# Patient Record
Sex: Male | Born: 1985 | Race: White | Hispanic: No | Marital: Married | Smoking: Never smoker
Health system: Southern US, Community
[De-identification: ages and names within clinical notes are randomized; demographics above are authoritative.]

## PROBLEM LIST (undated history)

## (undated) DIAGNOSIS — Z8659 Personal history of other mental and behavioral disorders: Secondary | ICD-10-CM

## (undated) DIAGNOSIS — R002 Palpitations: Secondary | ICD-10-CM

## (undated) DIAGNOSIS — B019 Varicella without complication: Secondary | ICD-10-CM

## (undated) DIAGNOSIS — R55 Syncope and collapse: Secondary | ICD-10-CM

## (undated) DIAGNOSIS — G937 Reye's syndrome: Secondary | ICD-10-CM

## (undated) HISTORY — DX: Syncope and collapse: R55

## (undated) HISTORY — DX: Personal history of other mental and behavioral disorders: Z86.59

## (undated) HISTORY — DX: Palpitations: R00.2

## (undated) HISTORY — DX: Reye's syndrome: G93.7

## (undated) HISTORY — DX: Varicella without complication: B01.9

## (undated) HISTORY — PX: WISDOM TOOTH EXTRACTION: SHX21

---
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
## 2005-06-12 ENCOUNTER — Ambulatory Visit: Payer: Self-pay | Admitting: Internal Medicine

## 2005-07-14 ENCOUNTER — Ambulatory Visit: Payer: Self-pay | Admitting: Internal Medicine

## 2005-07-21 ENCOUNTER — Ambulatory Visit: Payer: Self-pay | Admitting: Internal Medicine

## 2005-08-23 ENCOUNTER — Ambulatory Visit: Payer: Self-pay | Admitting: Internal Medicine

## 2006-03-06 ENCOUNTER — Ambulatory Visit: Payer: Self-pay | Admitting: Internal Medicine

## 2007-04-12 ENCOUNTER — Ambulatory Visit: Payer: Self-pay | Admitting: Internal Medicine

## 2007-04-12 DIAGNOSIS — R002 Palpitations: Secondary | ICD-10-CM

## 2008-03-19 ENCOUNTER — Ambulatory Visit: Payer: Self-pay | Admitting: Internal Medicine

## 2008-03-19 DIAGNOSIS — D179 Benign lipomatous neoplasm, unspecified: Secondary | ICD-10-CM | POA: Insufficient documentation

## 2008-03-19 DIAGNOSIS — M546 Pain in thoracic spine: Secondary | ICD-10-CM

## 2008-03-19 DIAGNOSIS — G937 Reye's syndrome: Secondary | ICD-10-CM | POA: Insufficient documentation

## 2008-03-19 LAB — CONVERTED CEMR LAB
Glucose, Urine, Semiquant: NEGATIVE
Nitrite: NEGATIVE
Protein, U semiquant: NEGATIVE
WBC Urine, dipstick: NEGATIVE
pH: 6.5

## 2008-03-20 ENCOUNTER — Ambulatory Visit: Payer: Self-pay | Admitting: Internal Medicine

## 2008-03-24 ENCOUNTER — Encounter (INDEPENDENT_AMBULATORY_CARE_PROVIDER_SITE_OTHER): Payer: Self-pay | Admitting: *Deleted

## 2008-07-18 ENCOUNTER — Ambulatory Visit: Payer: Self-pay | Admitting: Family Medicine

## 2008-10-20 ENCOUNTER — Ambulatory Visit: Payer: Self-pay | Admitting: Internal Medicine

## 2008-11-27 ENCOUNTER — Ambulatory Visit: Payer: Self-pay | Admitting: Internal Medicine

## 2008-11-27 DIAGNOSIS — L255 Unspecified contact dermatitis due to plants, except food: Secondary | ICD-10-CM

## 2009-03-16 ENCOUNTER — Ambulatory Visit: Payer: Self-pay | Admitting: Internal Medicine

## 2009-03-16 DIAGNOSIS — K645 Perianal venous thrombosis: Secondary | ICD-10-CM | POA: Insufficient documentation

## 2010-01-24 ENCOUNTER — Ambulatory Visit: Payer: Self-pay | Admitting: Internal Medicine

## 2010-01-24 DIAGNOSIS — M26629 Arthralgia of temporomandibular joint, unspecified side: Secondary | ICD-10-CM

## 2010-04-14 IMAGING — CR DG THORACIC SPINE 2V
3 series · 3 of 3 positions shown · non-contrast
Comparison: None available.

CLINICAL DATA: Back pain.

THORACIC SPINE - 2 VIEW

[view not recorded (1 of 3)]
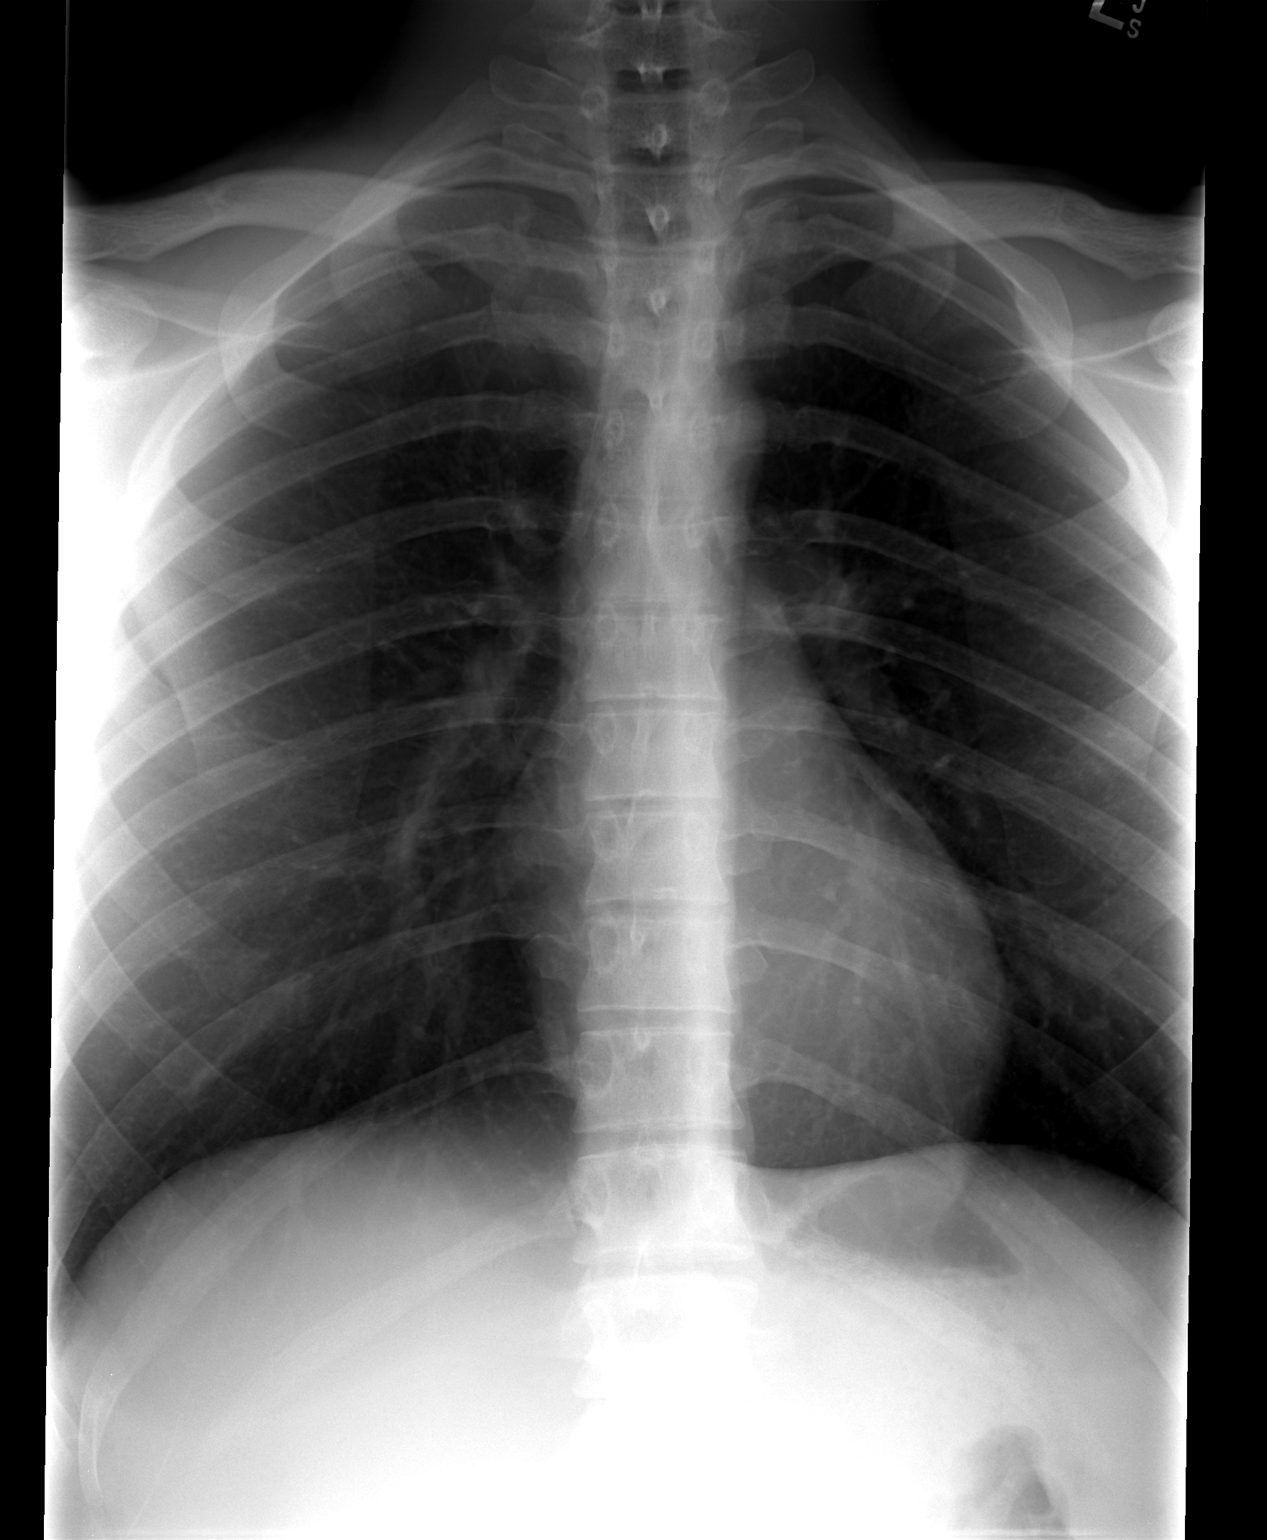

[view not recorded (2 of 3)]
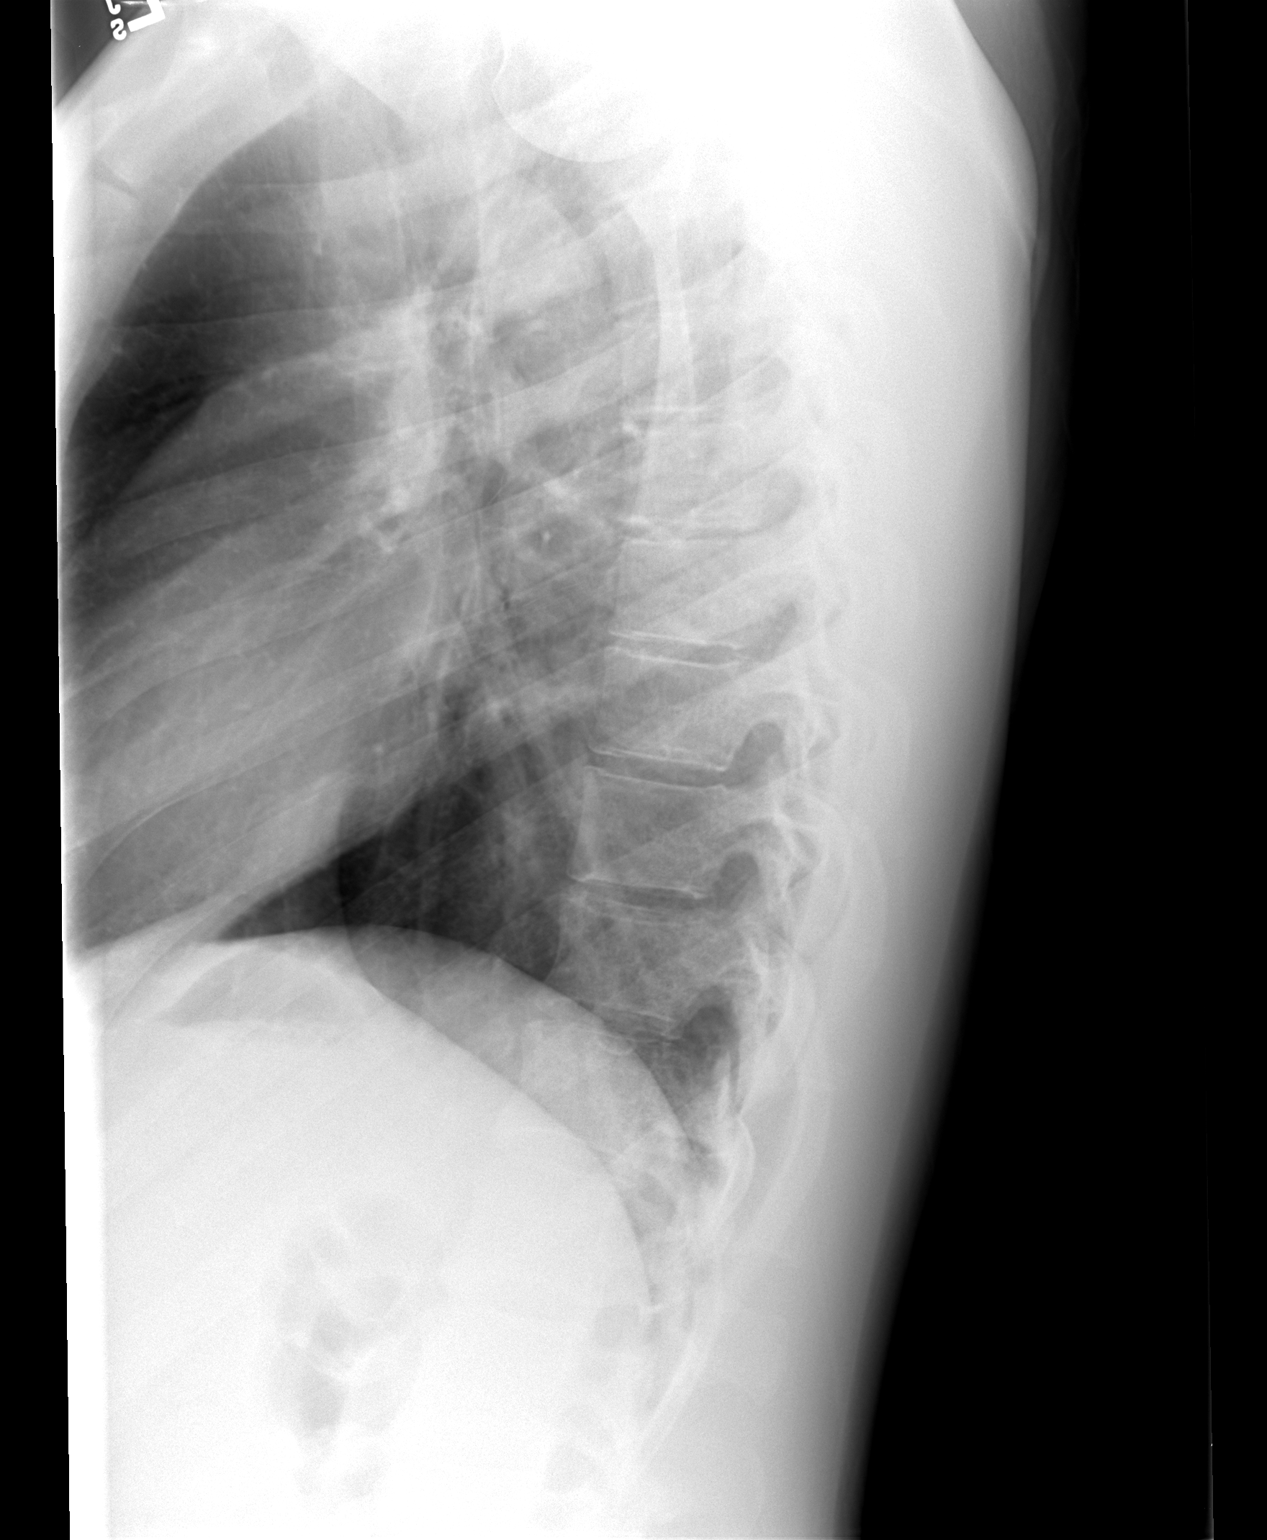

[view not recorded (3 of 3)]
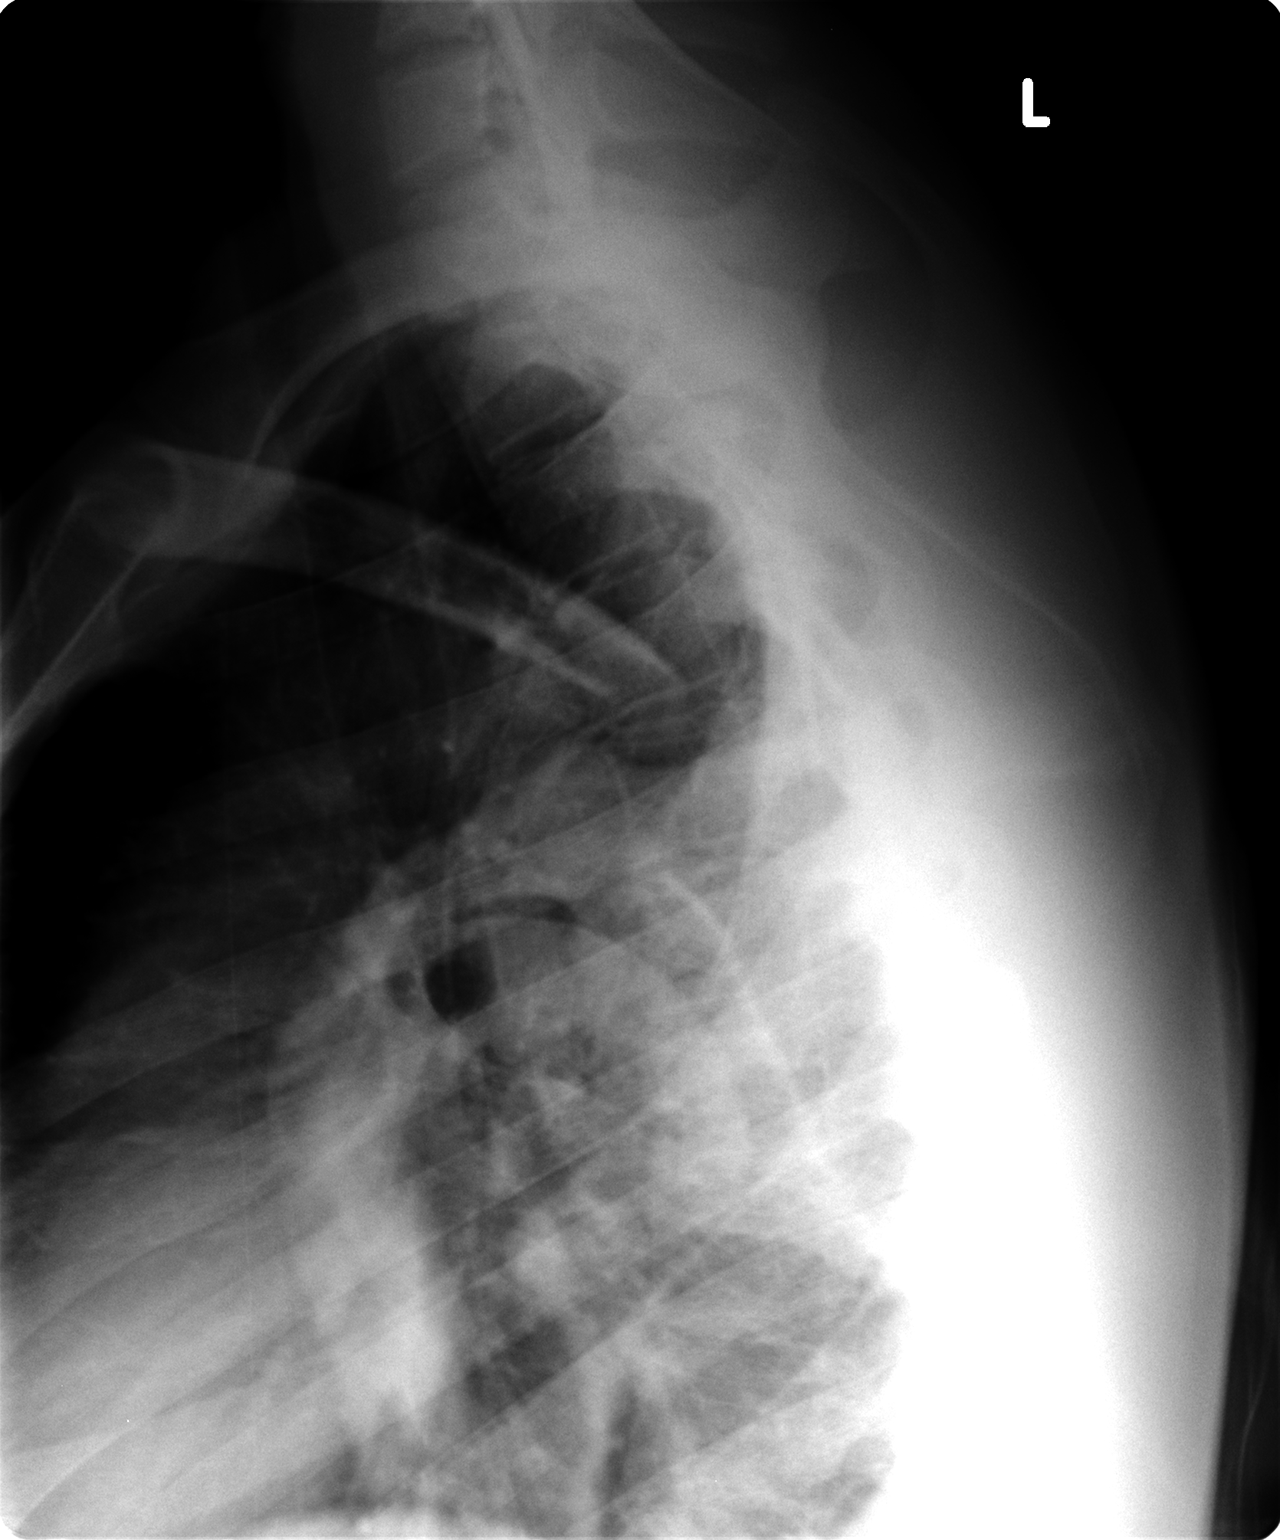

[3 of 3 positions shown; findings below may reference images not displayed]

FINDINGS: Vertebral body height and alignment appear normal.  No
evidence of degenerative disease.
IMPRESSION: Negative exam.

## 2010-07-20 ENCOUNTER — Ambulatory Visit
Admission: RE | Admit: 2010-07-20 | Discharge: 2010-07-20 | Payer: Self-pay | Source: Home / Self Care | Attending: Internal Medicine | Admitting: Internal Medicine

## 2010-07-20 DIAGNOSIS — M545 Low back pain: Secondary | ICD-10-CM | POA: Insufficient documentation

## 2010-07-26 NOTE — Assessment & Plan Note (Signed)
Summary: JAW PAIN//KN   Vital Signs:  Patient profile:   25 year old male Weight:      164.8 pounds Temp:     98.3 degrees F oral Pulse rate:   56 / minute Resp:     12 per minute BP sitting:   100 / 64  (left arm) Cuff size:   large  Vitals Entered By: Shonna Chock CMA (January 24, 2010 12:14 PM) CC: Right side Jaw pain x 1.5 week(s), Lower Extremity Joint pain   CC:  Right side Jaw pain x 1.5 week(s) and Lower Extremity Joint pain.  History of Present Illness:       This is a 25 year old man who presents with R jaw pain upon awakening 01/14/2010.  The patient denies swelling, redness, locking, popping, and decreased ROM.  The pain began suddenly and with no injury.  The pain is described as aching and intermittent.  The patient denies the following symptoms: fever, rash, URI  or eye symptoms.    Current Medications (verified): 1)  None  Allergies: 1)  ! Pcn  Review of Systems General:  Denies chills and sweats. ENT:  Denies ear discharge, earache, nasal congestion, postnasal drainage, and sore throat. Derm:  Denies lesion(s) and rash.  Physical Exam  General:  well-nourished,in no acute distress; alert,appropriate and cooperative throughout examination Ears:  External ear exam shows no significant lesions or deformities.  Otoscopic examination reveals clear canals, tympanic membranes are intact bilaterally without bulging, retraction, inflammation or discharge. Hearing is grossly normal bilaterally. Some wax bilaterally Nose:  External nasal examination shows no deformity or inflammation. Nasal mucosa are pink and moist without lesions or exudates. Septum to R Mouth:  Oral mucosa and oropharynx without lesions or exudates.  Teeth in good repair. Classic TMJ  Skin:  Intact without suspicious lesions or rashes Cervical Nodes:  No lymphadenopathy noted Axillary Nodes:  No palpable lymphadenopathy   Impression & Recommendations:  Problem # 1:  TEMPOROMANDIBULAR JOINT PAIN  (ICD-524.62)  Patient Instructions: 1)  Glucosamine sul;fate 1500 mg once daily  & Aleve 1-2 every 8-12 hrs as needed. Soft diet until well. Moist heat  3-4x/ day  as needed.  Dental appt if symptoms persist.

## 2010-07-28 NOTE — Assessment & Plan Note (Signed)
Summary: back problems//lch   Vital Signs:  Patient profile:   25 year old male Height:      71 inches Weight:      158.6 pounds BMI:     22.20 Pulse rate:   60 / minute Resp:     14 per minute BP sitting:   114 / 70  (left arm) Cuff size:   large  Vitals Entered By: Shonna Chock CMA (July 20, 2010 8:05 AM) CC: Back pain x 3 weeks, patient had a snowboarding injury   CC:  Back pain x 3 weeks and patient had a snowboarding injury.  History of Present Illness: Back Pain      This is a 25 year old man who presents with Back pain since 07/01/2010.  The patient denies fever, chills, weakness, loss of sensation, fecal incontinence, urinary incontinence, and urinary retention.  The pain is located in the mid low back.  The pain began after a fall, snowboarding on icy slope.  The pain  does not radiate but he has tightness in legs. The pain is made worse by spine  extension.  The pain is made better by ice & heat.  Chiropractry helped partially. Films revealed " crooked alignment (of spine )with muscle contraction  on one side"  Current Medications (verified): 1)  None  Allergies: 1)  ! Pcn  Physical Exam  General:  well-nourished,in no acute distress; alert,appropriate and cooperative throughout examination Msk:  He lay down & sat up w/o help. No pain with percussion . Neg SLR to 90 degrees Extremities:  No clubbing, cyanosis, edema, or deformity noted with normal full range of motion of all joints.   Neurologic:  alert & oriented X3, strength normal in all extremities,  heel & toe gait normal, and DTRs symmetrical , 1/2+ @ knees   Skin:  Intact without suspicious lesions or rashes   Impression & Recommendations:  Problem # 1:  LOW BACK PAIN SYNDROME (ICD-724.2)  His updated medication list for this problem includes:    Cyclobenzaprine Hcl 5 Mg Tabs (Cyclobenzaprine hcl) .Marland Kitchen... 1 two times a day & 1-2 at bedtime as needed    Tramadol Hcl 50 Mg Tabs (Tramadol hcl) .Marland Kitchen... 1 every  6 hrs as needed pain  Complete Medication List: 1)  Cyclobenzaprine Hcl 5 Mg Tabs (Cyclobenzaprine hcl) .Marland Kitchen.. 1 two times a day & 1-2 at bedtime as needed 2)  Tramadol Hcl 50 Mg Tabs (Tramadol hcl) .Marland Kitchen.. 1 every 6 hrs as needed pain  Patient Instructions: 1)  Stretching exercises as discussed ( Nautilus, yoga, swimming , stretch aerobics). D/C ice ; use moist heat OR Zostrix cream. Prescriptions: TRAMADOL HCL 50 MG TABS (TRAMADOL HCL) 1 every 6 hrs as needed pain  #30 x 0   Entered and Authorized by:   Marga Melnick MD   Signed by:   Marga Melnick MD on 07/20/2010   Method used:   Print then Give to Patient   RxID:   [id number] CYCLOBENZAPRINE HCL 5 MG TABS (CYCLOBENZAPRINE HCL) 1 two times a day & 1-2 at bedtime as needed  #20 x 0   Entered and Authorized by:   Marga Melnick MD   Signed by:   Marga Melnick MD on 07/20/2010   Method used:   Print then Give to Patient   RxID:   [id number]    Orders Added: 1)  Est. Patient Level III [03474]

## 2010-08-15 ENCOUNTER — Telehealth (INDEPENDENT_AMBULATORY_CARE_PROVIDER_SITE_OTHER): Payer: Self-pay | Admitting: *Deleted

## 2010-08-23 NOTE — Progress Notes (Signed)
Summary: refill  Phone Note Refill Request Message from:  Fax from Pharmacy on August 15, 2010 10:20 AM  Refills Requested: Medication #1:  CYCLOBENZAPRINE HCL 5 MG TABS 1 two times a day & 1-2 at bedtime as needed [facility] - fax [phone]  Initial call taken by: Okey Regal [place],  August 15, 2010 10:21 AM    Prescriptions: CYCLOBENZAPRINE HCL 5 MG TABS (CYCLOBENZAPRINE HCL) 1 two times a day & 1-2 at bedtime as needed  #20 x 0   Entered by:   Shonna Chock CMA   Authorized by:   Marga Melnick MD   Signed by:   Shonna Chock CMA on 08/15/2010   Method used:   Electronically to        [facility]* (retail)       [address] [place], [place]  [address]       Ph: [phone] or [phone]       Fax: [phone]   RxID:   [id number]

## 2010-12-10 ENCOUNTER — Encounter: Payer: Self-pay | Admitting: Internal Medicine

## 2010-12-12 ENCOUNTER — Encounter: Payer: Self-pay | Admitting: Internal Medicine

## 2010-12-12 ENCOUNTER — Ambulatory Visit (INDEPENDENT_AMBULATORY_CARE_PROVIDER_SITE_OTHER): Payer: BC Managed Care – PPO | Admitting: Internal Medicine

## 2010-12-12 VITALS — BP 112/62 | HR 64 | Temp 98.6°F | Wt 154.2 lb

## 2010-12-12 DIAGNOSIS — R011 Cardiac murmur, unspecified: Secondary | ICD-10-CM

## 2010-12-12 DIAGNOSIS — R002 Palpitations: Secondary | ICD-10-CM

## 2010-12-12 LAB — T3, FREE: T3, Free: 3 pg/mL (ref 2.3–4.2)

## 2010-12-12 LAB — CALCIUM: Calcium: 9.1 mg/dL (ref 8.4–10.5)

## 2010-12-12 LAB — MAGNESIUM: Magnesium: 2.5 mg/dL (ref 1.5–2.5)

## 2010-12-12 NOTE — Progress Notes (Signed)
  Subjective:    Patient ID: Edward Lin, male    DOB: Mar 10, 1986, 25 y.o.   MRN: [id number]  HPI Palpitations Frequency: daily to every other day Duration:few seconds Quality:"something moving in chest / heart slowing down then loss of breath" Triggers:running; he is concerned about continuing exercise Treatment; none Constitutional:no fever, chills, sweats, weight change, fatigue, sleep issues Cardiovascular:no chest pain, syncope, diaphoresis, claudication GI:no change in bowels, anorexia Derm:no skin, hair, or nail changes Neurologic:no tremor, numbness and tingling Psych:no anxiety, depression, panic attacks Endocrine:no hoarseness, temperature intolerance Possible triggers:no new medications, stimulants(decongestants, diet pills, nicotine).decaf tea,1  soft drink weekly, occasional chocolate FH: no cardiac or thyroid issues    Review of Systems     Objective:   Physical Exam Gen.: Healthy and well-nourished in appearance. Alert, appropriate and cooperative throughout exam. Eyes: No corneal or conjunctival inflammation noted.  Extraocular motion intact. Neck: No deformities, masses, or tenderness noted. Range of motion &. Thyroid normal. Lungs: Normal respiratory effort; chest expands symmetrically. Lungs are clear to auscultation without rales, wheezes, or increased work of breathing. Heart: Normal rate and rhythm. Normal S1 and S2. No gallop,  or rub. Classic click suggested @ apex w/o murmur. Abdomen: Bowel sounds normal; abdomen soft and nontender. No masses, organomegaly or hernias noted. No clubbing, cyanosis, edema, or deformity noted. Tone & strength  normal.Nail health  good. Vascular: Carotid, radial artery, dorsalis pedis and dorsalis posterior tibial pulses are full and equal. No bruits present. Neurologic: Alert and oriented x3. Deep tendon reflexes symmetrical and normal.          Skin: Intact without suspicious lesions or rashes. Lymph: No cervical, axillary,  or inguinal lymphadenopathy present. Psych: Mood and affect are normal. Normally interactive                                                                                         Assessment & Plan:  #1 palpitations #2 Mitral Click Plan: see Orders Note: EKG ; no prematures; no WPW or abnormal rhythm

## 2010-12-12 NOTE — Patient Instructions (Signed)
Avoid excess stimulants as discussed.  Go to Web MD for Mitral Valve Prolapse.

## 2010-12-13 ENCOUNTER — Telehealth: Payer: Self-pay | Admitting: Internal Medicine

## 2010-12-13 NOTE — Telephone Encounter (Signed)
When pre-certing, the call begins with an info only intake person, if not approved at that level, call is transferred to a Nurse Reviewer.  At the Nurse Reviewer level I am asked specific questions in which I always give all the information as I am reading it from the actual office notes.  If the Nurse does not approve even then, the option of faxing notes, that I've already read to them, is not given.  I did however, fax the office visit to them anyway today, and am awaiting a call or fax from the insurance with their decision.  I will let you know as soon as I get their decision.

## 2010-12-13 NOTE — Telephone Encounter (Signed)
Did they review OV note; if so & pre Auth still required , change to cardiology referral

## 2010-12-14 NOTE — Telephone Encounter (Signed)
See append to OV, cardiology referral placed

## 2010-12-14 NOTE — Telephone Encounter (Signed)
Per my call to insurance, after reviewing OV that was faxed, they state no additional clinical information in notes that was not already provided, and a Peer to Peer still required.  I informed them to cancel the request at this point & they did.  Please cancel order for Echo Stress, and will need Cardiology referral for Consult please.

## 2010-12-14 NOTE — Progress Notes (Signed)
Addended by: Edgardo Roys on: 12/14/2010 11:27 AM   Modules accepted: Orders

## 2010-12-14 NOTE — Telephone Encounter (Signed)
Patient aware of process, we are waiting to hear from insurance company (3 days to respond) and if not covered we will proceed with cardiology referral. Patient ok'd information.

## 2010-12-20 ENCOUNTER — Encounter: Payer: Self-pay | Admitting: *Deleted

## 2010-12-21 ENCOUNTER — Encounter: Payer: Self-pay | Admitting: Cardiology

## 2010-12-21 ENCOUNTER — Ambulatory Visit (INDEPENDENT_AMBULATORY_CARE_PROVIDER_SITE_OTHER): Payer: BC Managed Care – PPO | Admitting: Cardiology

## 2010-12-21 VITALS — BP 108/68 | HR 68 | Resp 18 | Ht 71.0 in | Wt 157.0 lb

## 2010-12-21 DIAGNOSIS — R002 Palpitations: Secondary | ICD-10-CM | POA: Insufficient documentation

## 2010-12-21 DIAGNOSIS — R0600 Dyspnea, unspecified: Secondary | ICD-10-CM | POA: Insufficient documentation

## 2010-12-21 DIAGNOSIS — R0989 Other specified symptoms and signs involving the circulatory and respiratory systems: Secondary | ICD-10-CM

## 2010-12-21 NOTE — Assessment & Plan Note (Signed)
Only occurred with palpitations. Schedule echocardiogram to quantify LV function.

## 2010-12-21 NOTE — Progress Notes (Signed)
HPI: 25 year old male with no prior cardiac history for evaluation of dyspnea and palpitations. Patient has had intermittent palpitations for approximately 1-1/2 years. They initially improved after discontinuing caffeine. However recently they have become more frequent. They now occur almost daily. They are described as a flutter. They last several seconds and resolve spontaneously. There is associated dyspnea. There is no chest pain, presyncope or syncope. He otherwise does not have dyspnea on exertion, orthopnea, PND, pedal edema, chest pain or history of syncope.  No current outpatient prescriptions on file.    Allergies  Allergen Reactions  . Penicillins     Past Medical History  Diagnosis Date  . Reye syndrome   . Dermatitis     hx of    Past Surgical History  Procedure Date  . Appendectomy   . Wisdom tooth extraction     History   Social History  . Marital Status: Single    Spouse Name: N/A    Number of Children: N/A  . Years of Education: N/A   Occupational History  . Not on file.   Social History Main Topics  . Smoking status: Never Smoker   . Smokeless tobacco: Not on file  . Alcohol Use: Yes     Occasional  . Drug Use: No  . Sexually Active: Not on file   Other Topics Concern  . Not on file   Social History Narrative  . No narrative on file    Family History  Problem Relation Age of Onset  . Deep vein thrombosis Father     ROS: no fevers or chills, productive cough, hemoptysis, dysphasia, odynophagia, melena, hematochezia, dysuria, hematuria, rash, seizure activity, orthopnea, PND, pedal edema, claudication. Remaining systems are negative.  Physical Exam: General:  Well developed/well nourished in NAD Skin warm/dry Patient not depressed No peripheral clubbing Back-normal HEENT-normal/normal eyelids Neck supple/normal carotid upstroke bilaterally; no bruits; no JVD; no thyromegaly chest - CTA/ normal expansion CV - RRR/normal S1 and S2; no  murmurs, rubs or gallops;  PMI nondisplaced Abdomen -NT/ND, no HSM, no mass, + bowel sounds, no bruit 2+ femoral pulses, no bruits Ext-no edema, chords, 2+ DP Neuro-grossly nonfocal  ECG December 12, 2010, sinus rhythm with no delta wave and no ST changes.

## 2010-12-21 NOTE — Assessment & Plan Note (Signed)
Schedule CardioNet. Question SVT. Schedule echocardiogram. Laboratories performed on December 12, 2010 showed a normal TSH, normal free T4 and free T3, normal magnesium and potassium.

## 2010-12-21 NOTE — Patient Instructions (Signed)
Your physician recommends that you schedule a follow-up appointment in: 6-8 WEEKS  Your physician has requested that you have an echocardiogram. Echocardiography is a painless test that uses sound waves to create images of your heart. It provides your doctor with information about the size and shape of your heart and how well your heart's chambers and valves are working. This procedure takes approximately one hour. There are no restrictions for this procedure.   Your physician has recommended that you wear an event monitor. Event monitors are medical devices that record the heart's electrical activity. Doctors most often [place] these monitors to diagnose arrhythmias. Arrhythmias are problems with the speed or rhythm of the heartbeat. The monitor is a small, portable device. You can wear one while you do your normal daily activities. This is usually used to diagnose what is causing palpitations/syncope (passing out).

## 2010-12-22 ENCOUNTER — Other Ambulatory Visit (HOSPITAL_COMMUNITY): Payer: BC Managed Care – PPO

## 2010-12-22 ENCOUNTER — Encounter: Payer: Self-pay | Admitting: *Deleted

## 2010-12-22 ENCOUNTER — Other Ambulatory Visit (HOSPITAL_COMMUNITY): Payer: BC Managed Care – PPO | Admitting: Radiology

## 2010-12-26 ENCOUNTER — Encounter: Payer: Self-pay | Admitting: Cardiology

## 2010-12-30 ENCOUNTER — Encounter (INDEPENDENT_AMBULATORY_CARE_PROVIDER_SITE_OTHER): Payer: BC Managed Care – PPO

## 2010-12-30 ENCOUNTER — Ambulatory Visit (HOSPITAL_COMMUNITY): Payer: BC Managed Care – PPO | Attending: Cardiology | Admitting: Radiology

## 2010-12-30 DIAGNOSIS — R002 Palpitations: Secondary | ICD-10-CM

## 2010-12-30 DIAGNOSIS — I059 Rheumatic mitral valve disease, unspecified: Secondary | ICD-10-CM | POA: Insufficient documentation

## 2010-12-30 DIAGNOSIS — R0989 Other specified symptoms and signs involving the circulatory and respiratory systems: Secondary | ICD-10-CM | POA: Insufficient documentation

## 2010-12-30 DIAGNOSIS — R0609 Other forms of dyspnea: Secondary | ICD-10-CM | POA: Insufficient documentation

## 2010-12-30 DIAGNOSIS — I079 Rheumatic tricuspid valve disease, unspecified: Secondary | ICD-10-CM | POA: Insufficient documentation

## 2011-01-05 ENCOUNTER — Telehealth: Payer: Self-pay | Admitting: Cardiology

## 2011-01-05 NOTE — Telephone Encounter (Signed)
Spoke with pt, he is aware of his appt 02-01-11. He will call with further problems Edward Lin

## 2011-01-05 NOTE — Telephone Encounter (Signed)
[name] as scheduled Edward Lin   

## 2011-01-05 NOTE — Telephone Encounter (Signed)
Spoke with pt, he has been having a tightness in his chest for the last two days. The tightness is consistent and does not change with position or taking a deep breath. He does not report significant SOB. He has had no stomach issues but does report occ lightheadedness. The discomfort does not change with exercise. He reports the discomfort has eased off this afternoon. He has not tried tylenol or anything for the discomfort. Will make dr Jens Som aware Deliah Goody

## 2011-01-05 NOTE — Telephone Encounter (Signed)
Pt calling re tightness in his chest for about two days

## 2011-01-25 DIAGNOSIS — R002 Palpitations: Secondary | ICD-10-CM

## 2011-01-25 HISTORY — DX: Palpitations: R00.2

## 2011-02-01 ENCOUNTER — Ambulatory Visit (INDEPENDENT_AMBULATORY_CARE_PROVIDER_SITE_OTHER): Payer: BC Managed Care – PPO | Admitting: Cardiology

## 2011-02-01 ENCOUNTER — Encounter: Payer: Self-pay | Admitting: Cardiology

## 2011-02-01 VITALS — BP 104/60 | HR 68 | Resp 18 | Ht 71.0 in | Wt 158.0 lb

## 2011-02-01 DIAGNOSIS — R002 Palpitations: Secondary | ICD-10-CM

## 2011-02-01 NOTE — Progress Notes (Signed)
HPI:25 year old male with no prior cardiac history I saw in June of 2012 for evaluation of dyspnea and palpitations. Echocardiogram in July of 2012 showed an ejection fraction of 50-55% and no significant valvular disease. CardioNet showed sinus rhythm with occasional PAC and PVC. Since I last saw him, he denies dyspnea on exertion, orthopnea, PND, syncope or chest pain. He occasionally feels a rare skip but no sustained palpitations. He did have palpitations while wearing his monitor.   No current outpatient prescriptions on file.     Past Medical History  Diagnosis Date  . Reye syndrome   . Dermatitis     hx of    Past Surgical History  Procedure Date  . Appendectomy   . Wisdom tooth extraction     History   Social History  . Marital Status: Single    Spouse Name: N/A    Number of Children: N/A  . Years of Education: N/A   Occupational History  . Not on file.   Social History Main Topics  . Smoking status: Never Smoker   . Smokeless tobacco: Not on file  . Alcohol Use: Yes     Occasional  . Drug Use: No  . Sexually Active: Not on file   Other Topics Concern  . Not on file   Social History Narrative  . No narrative on file    ROS: no fevers or chills, productive cough, hemoptysis, dysphasia, odynophagia, melena, hematochezia, dysuria, hematuria, rash, seizure activity, orthopnea, PND, pedal edema, claudication. Remaining systems are negative.  Physical Exam: Well-developed well-nourished in no acute distress.  Skin is warm and dry.  HEENT is normal.  Neck is supple. No thyromegaly.  Chest is clear to auscultation with normal expansion.  Cardiovascular exam is regular rate and rhythm.  Abdominal exam nontender or distended. No masses palpated. Extremities show no edema. neuro grossly intact

## 2011-02-01 NOTE — Assessment & Plan Note (Signed)
Monitor showed occasional PAC and PVC but no sustained arrhythmia. His symptoms have improved. I have explained that PACs and PVCs are benign in the setting of normal LV function. I do not think they are particularly symptomatic at this point. Patient instructed to avoid excessive alcohol use and caffeine use. Consider adding beta blocker in the future if symptoms worsen.

## 2011-03-10 ENCOUNTER — Encounter: Payer: Self-pay | Admitting: Internal Medicine

## 2011-05-16 ENCOUNTER — Encounter: Payer: Self-pay | Admitting: Internal Medicine

## 2011-05-19 ENCOUNTER — Ambulatory Visit (INDEPENDENT_AMBULATORY_CARE_PROVIDER_SITE_OTHER): Payer: BC Managed Care – PPO | Admitting: Internal Medicine

## 2011-05-19 ENCOUNTER — Encounter: Payer: Self-pay | Admitting: Internal Medicine

## 2011-05-19 VITALS — BP 126/70 | HR 83 | Temp 97.9°F | Ht 70.5 in | Wt 159.6 lb

## 2011-05-19 DIAGNOSIS — Z0000 Encounter for general adult medical examination without abnormal findings: Secondary | ICD-10-CM

## 2011-05-19 DIAGNOSIS — Z23 Encounter for immunization: Secondary | ICD-10-CM

## 2011-05-19 NOTE — Patient Instructions (Signed)
Preventive Health Care: Exercise at least 30-45 minutes a day,  3-4 days a week.  Eat a low-fat diet with lots of fruits and vegetables, up to 7-9 servings per day. Consume less than 40 grams of sugar per day from foods & drinks with High Fructose Corn Sugar as #1,2,3 or # 4 on label. Safe sex - if you may be exposed to STDs, use a condom. Depression is common in our stressful world.If you're feeling down or losing interest in things you normally enjoy, please call . To prevent palpitations or premature beats, avoid stimulants such as decongestants, diet pills, nicotine, or caffeine (coffee, tea, cola, or chocolate) to excess.  Please  schedule fasting Labs : Lipids, hepatic panel, CBC & dif, coagulopathy panel (father Factor 5 deficiency).  Please bring these instructions to that Lab appt.

## 2011-05-19 NOTE — Progress Notes (Signed)
Subjective:    Patient ID: Edward Lin, male    DOB: [birth year], 25 y.o.   MRN: [id number]  HPI  Edward Lin is here for a physical; he denies acute issues but he questions the need for coagulopathy evaluation in view of his family history of factor V deficiency in his father.      Review of Systems Patient reports no persistent or significant  vision/ hearing changes,anorexia, weight change, fever ,adenopathy,  recurrent hoarseness, swallowing issues, chest pain,palpitations, edema, cough, hemoptysis, dyspnea(rest, exertional, paroxysmal nocturnal), gastrointestinal  bleeding (melena, rectal bleeding), abdominal pain, excessive heart burn, GU symptoms( dysuria, hematuria, pyuria) syncope, focal weakness, numbness & tingling, skin/hair/nail changes,depression, anxiety, abnormal bruising/bleeding, or musculoskeletal symptoms/signs.  Cardiology evaluation and labs done 6/12 were reviewed. Copies were provided     Objective:   Physical Exam Gen.: Thin but healthy and well-nourished in appearance. Alert, appropriate and cooperative throughout exam. Head: Normocephalic without obvious abnormalities Eyes: No corneal or conjunctival inflammation noted. Pupils equal round reactive to light and accommodation. Fundal exam is benign without hemorrhages, exudate, papilledema. Extraocular motion intact. Vision grossly normal. Ears: External  ear exam reveals no significant lesions or deformities. Canals clear .TMs normal. Hearing is grossly normal bilaterally. Nose: External nasal exam reveals no deformity or inflammation. Nasal mucosa are pink and moist. No lesions or exudates noted.   Mouth: Oral mucosa and oropharynx reveal no lesions or exudates. Teeth in good repair. Neck: No deformities, masses, or tenderness noted. Range of motion &. Thyroid normal. Lungs: Normal respiratory effort; chest expands symmetrically. Lungs are clear to auscultation without rales, wheezes, or increased work of  breathing. Heart: Normal rate and rhythm. Normal S1 and S2. No gallop,  or rub. Intermittent apical click w/o murmur. Abdomen: Bowel sounds normal; abdomen soft and nontender. No masses, organomegaly or hernias noted. Genitalia: tiny granuloma L epididymal area; DRE deferred due to age   .                                                                                   Musculoskeletal/extremities: No deformity or scoliosis noted of  the thoracic or lumbar spine. No clubbing, cyanosis, edema, or deformity noted. Range of motion  normal .Tone & strength  normal.Joints normal. Nail health  good. Vascular: Carotid, radial artery, dorsalis pedis and  posterior tibial pulses are full and equal. No bruits present. Neurologic: Alert and oriented x3. Deep tendon reflexes symmetrical and normal.          Skin: Intact without suspicious lesions or rashes. Lymph: No cervical, axillary, or inguinal lymphadenopathy present. Psych: Mood and affect are normal. Normally interactive                                                                                         Assessment & Plan:  #1 comprehensive  physical exam; no acute findings #2 see Problem List with Assessments & Recommendations  #3 palpitations, essentially resolved. 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  #4 family history of coagulopathy, factor V deficiency, and his father Plan: see Orders

## 2011-05-22 ENCOUNTER — Other Ambulatory Visit: Payer: Self-pay | Admitting: Internal Medicine

## 2011-05-22 DIAGNOSIS — Z0000 Encounter for general adult medical examination without abnormal findings: Secondary | ICD-10-CM

## 2011-05-23 ENCOUNTER — Other Ambulatory Visit (INDEPENDENT_AMBULATORY_CARE_PROVIDER_SITE_OTHER): Payer: BC Managed Care – PPO

## 2011-05-23 DIAGNOSIS — Z0000 Encounter for general adult medical examination without abnormal findings: Secondary | ICD-10-CM

## 2011-05-23 LAB — LIPID PANEL
LDL Cholesterol: 110 mg/dL — ABNORMAL HIGH (ref 0–99)
Total CHOL/HDL Ratio: 3
VLDL: 6.4 mg/dL (ref 0.0–40.0)

## 2011-05-23 LAB — CBC WITH DIFFERENTIAL/PLATELET
Basophils Relative: 0.4 % (ref 0.0–3.0)
Eosinophils Absolute: 0.1 10*3/uL (ref 0.0–0.7)
Hemoglobin: 15 g/dL (ref 13.0–17.0)
Lymphocytes Relative: 25.6 % (ref 12.0–46.0)
MCHC: 34.2 g/dL (ref 30.0–36.0)
MCV: 86 fl (ref 78.0–100.0)
Monocytes Absolute: 0.5 10*3/uL (ref 0.1–1.0)
Neutro Abs: 4 10*3/uL (ref 1.4–7.7)
RBC: 5.11 Mil/uL (ref 4.22–5.81)

## 2011-05-23 LAB — HEPATIC FUNCTION PANEL
Bilirubin, Direct: 0.1 mg/dL (ref 0.0–0.3)
Total Bilirubin: 0.9 mg/dL (ref 0.3–1.2)

## 2011-05-23 NOTE — Progress Notes (Signed)
12  

## 2011-05-26 LAB — BASIC METABOLIC PANEL
BUN: 20 mg/dL (ref 6–23)
Chloride: 107 mEq/L (ref 96–112)
Creatinine, Ser: 1 mg/dL (ref 0.4–1.5)
Glucose, Bld: 82 mg/dL (ref 70–99)

## 2011-05-26 LAB — TSH: TSH: 0.59 u[IU]/mL (ref 0.35–5.50)

## 2012-07-31 ENCOUNTER — Ambulatory Visit (INDEPENDENT_AMBULATORY_CARE_PROVIDER_SITE_OTHER): Payer: BC Managed Care – PPO | Admitting: Internal Medicine

## 2012-07-31 ENCOUNTER — Encounter: Payer: Self-pay | Admitting: Internal Medicine

## 2012-07-31 VITALS — BP 104/66 | HR 74 | Temp 98.0°F | Wt 169.0 lb

## 2012-07-31 DIAGNOSIS — R55 Syncope and collapse: Secondary | ICD-10-CM

## 2012-07-31 DIAGNOSIS — R110 Nausea: Secondary | ICD-10-CM

## 2012-07-31 DIAGNOSIS — R42 Dizziness and giddiness: Secondary | ICD-10-CM

## 2012-07-31 LAB — CBC WITH DIFFERENTIAL/PLATELET
Basophils Absolute: 0 10*3/uL (ref 0.0–0.1)
Basophils Relative: 0.3 % (ref 0.0–3.0)
Eosinophils Absolute: 0.1 10*3/uL (ref 0.0–0.7)
Lymphocytes Relative: 9.1 % — ABNORMAL LOW (ref 12.0–46.0)
MCHC: 34.4 g/dL (ref 30.0–36.0)
Monocytes Absolute: 0.5 10*3/uL (ref 0.1–1.0)
Neutrophils Relative %: 84.9 % — ABNORMAL HIGH (ref 43.0–77.0)
Platelets: 163 10*3/uL (ref 150.0–400.0)
RBC: 5.32 Mil/uL (ref 4.22–5.81)
RDW: 13.2 % (ref 11.5–14.6)

## 2012-07-31 LAB — BASIC METABOLIC PANEL
BUN: 18 mg/dL (ref 6–23)
CO2: 29 mEq/L (ref 19–32)
Calcium: 9.5 mg/dL (ref 8.4–10.5)
Creatinine, Ser: 1 mg/dL (ref 0.4–1.5)

## 2012-07-31 MED ORDER — MECLIZINE HCL 25 MG PO TABS: ORAL_TABLET | ORAL | Status: DC

## 2012-07-31 NOTE — Patient Instructions (Addendum)
If you activate My Chart; the results can be released to you as soon as they populate from the lab. If you choose not to use this program; the labs have to be reviewed, copied & mailed   causing a delay in getting the results to you. 

## 2012-07-31 NOTE — Progress Notes (Signed)
  Subjective:    Patient ID: Edward Lin, male    DOB: [birth year], 27 y.o.   MRN: [id number]  HPI  Onset of symptoms was 2 weeks ago as  lightheadedness with one episode of pre syncope. Some sinus congestion w/o pain or pressure but intermittent nasal purulence with onset of symptoms but not now.Nausea has been constant as of today. He also feels weak without demonstrated loss of strength. There was no other  specific exacerbating factor such as  position change such as turning in bed ; arising from bed / chair; neck rotation/ extension but symptoms ?worse with ambulation. Symptoms were not related to straining ,pain, cardiac or neurologic prodrome. Duration of symptoms has been essentially constant for 2 weeks.  No treatment to date for symptoms .    No seizure activity or syncope described. 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       Review of Systems Constitutional : No fever , chills, sweats , change in weight ENT:No hearing loss/tinnitus;earache or otic discharge; no extrinsic symptoms Eye: No blurred vision , diplopia, vision loss Cardiac prodrome: No palpitations, irregular rhythm, heart rate change, sweating Pulmonary: No acute dyspnea, cough, hemoptysis GI: no melena or rectal bleeding Neurologic :No headache, numbness and tingling, change in coordination (gait/falling) Psych: No significant anxiety,panic , depression  Has never had a constellation of headache, flushing, chest pain, and diarrhea      Objective:   Physical Exam Gen. appearance: Well-nourished, in no distress Eyes: Extraocular motion intact, field of vision normal, vision grossly intact, no nystagmus ENT: Some wax bilaterally,partially visulaized tympanic membranes normal, tuning fork exam normal, hearing grossly normal. Nares dry with minimal erythema Neck: Normal range of motion, no masses, normal thyroid Cardiovascular: Rate and rhythm normal; no murmurs,  gallops or extra heart sounds Muscle skeletal: Range of motion, tone, &  strength normal Neuro:Oriented X3.No cranial nerve deficit, deep tendon  reflexes normal, gait including tiptoe & heel walk normal. Rhomberg & finger to nose negative. Lymph: No cervical or axillary LA Skin: Warm and dry without suspicious lesions or rashes Psych: no anxiety or mood change. Normally interactive and cooperative.              Assessment & Plan:  #1 dizziness x2 weeks; one episode of presyncope. Negative neurologic exam  #2 nausea today  #3 intermittent nasal purulence; resolved  #4 PMH vasovagal reaction with blood draw Plan: See orders and recommendations

## 2012-08-01 ENCOUNTER — Telehealth: Payer: Self-pay | Admitting: Internal Medicine

## 2012-08-01 NOTE — Telephone Encounter (Signed)
Per Dr.Hopper phenergan supp 12.5 #6 and continue with meclizine (give it time to work). I called patient and patient indicated he really does not have nausea, patient did not want suppository called in at this, patient verbalized understanding of Dr.Hopper's instruction. Patient was also asked if he needed work note for today and responded: No. Patient to allow time for meclizine to work and call back if needed

## 2012-08-01 NOTE — Telephone Encounter (Signed)
Patient Information:  Caller Name: Tal  Phone: [phone]  Patient: Edward Lin, Edward Lin  Gender: Male  DOB: Apr 05, 1986  Age: 27 Years  PCP: Lelon Perla.  Office Follow Up:  Does the office need to follow up with this patient?: Yes  Instructions For The Office: Please review, declines appt, wanting Rx called in.   Symptoms  Reason For Call & Symptoms: Dizziness for 2 weeks, worse for the last 2 days, started 2/4.  Has felt lightheaded like in dream, felt like things moving around him.  Seen in office yesterday 2/5, started Meclizine.  By evening 7 pm started chills, body aches, felt hot but could not take temperature due to no thermometer.  Continues sinus pressure, mild headache.  Too much constant dizziness to work, nausea continues.  pulse 76.  Declines Appt for today, requesting something be called in.  Reviewed Health History In EMR: Yes  Reviewed Medications In EMR: Yes  Reviewed Allergies In EMR: Yes  Reviewed Surgeries / Procedures: Yes  Date of Onset of Symptoms: 07/18/2012  Treatments Tried: Meclizine given in office yesterday  Treatments Tried Worked: No  Guideline(s) Used:  Dizziness  Sinus Pain and Congestion  Disposition Per Guideline:   See Today or Tomorrow in Office  Reason For Disposition Reached:   Sinus congestion (pressure, fullness) present > 10 days  Advice Given:  Drink Fluids:  Drink several glasses of fruit juice, other clear fluids, or water. This will improve hydration and blood glucose. If you have a fever or have had heat exposure, make sure the fluids are cold.  Rest for 1-2 Hours:  Lie down with feet elevated for 1 hour. This will improve blood flow and increase blood flow to the brain.  Stand Up Slowly:  In the mornings, sit up for a few minutes before you stand up. That will help your blood flow make the adjustment.  If you have to stand up for long periods of time, contract and relax your leg muscles to help pump the blood back to the  heart.  Sit down or lie down if you feel dizzy.  Call Back If:  Still feel dizzy after 2 hours of rest and fluids  Passes out (faints)  You become worse.  Patient Refused Recommendation:  Patient Requests Prescription  Declines Appt today, requesting Rx to be called into [facility] [phone]

## 2012-08-10 ENCOUNTER — Other Ambulatory Visit: Payer: Self-pay

## 2013-01-10 ENCOUNTER — Encounter: Payer: Self-pay | Admitting: Internal Medicine

## 2013-01-10 ENCOUNTER — Ambulatory Visit (INDEPENDENT_AMBULATORY_CARE_PROVIDER_SITE_OTHER): Payer: BC Managed Care – PPO | Admitting: Internal Medicine

## 2013-01-10 VITALS — BP 106/68 | HR 52 | Temp 98.3°F | Resp 12 | Ht 70.03 in | Wt 162.0 lb

## 2013-01-10 DIAGNOSIS — R55 Syncope and collapse: Secondary | ICD-10-CM | POA: Insufficient documentation

## 2013-01-10 DIAGNOSIS — Z0000 Encounter for general adult medical examination without abnormal findings: Secondary | ICD-10-CM

## 2013-01-10 LAB — CBC WITH DIFFERENTIAL/PLATELET
Eosinophils Absolute: 0.1 10*3/uL (ref 0.0–0.7)
Hemoglobin: 14.9 g/dL (ref 13.0–17.0)
Lymphocytes Relative: 25 % (ref 12–46)
Lymphs Abs: 1.5 10*3/uL (ref 0.7–4.0)
MCH: 29 pg (ref 26.0–34.0)
Monocytes Relative: 8 % (ref 3–12)
Neutro Abs: 4.1 10*3/uL (ref 1.7–7.7)
Neutrophils Relative %: 66 % (ref 43–77)
RBC: 5.13 MIL/uL (ref 4.22–5.81)

## 2013-01-10 NOTE — Addendum Note (Signed)
Addended by: Silvio Pate D on: 01/10/2013 02:37 PM   Modules accepted: Orders

## 2013-01-10 NOTE — Progress Notes (Signed)
  Subjective:    Patient ID: Edward Lin, male    DOB: Apr 27, 1986, 27 y.o.   MRN: [id number]  HPI  He is here for a physical;acute issues denied     Review of Systems He is on a heart healthy diet; he exercises 60 minutes as cross fit 4-5  times per week without symptoms. Specifically he denies chest pain, palpitations, dyspnea, or claudication. Family history is positive for premature coronary disease in paternal uncle @ 62.      Objective:   Physical Exam  Gen.: Thin but healthy and well-nourished in appearance. Alert, appropriate and cooperative throughout exam. Head: Normocephalic without obvious abnormalities;  goatee  Eyes: No corneal or conjunctival inflammation noted.  Extraocular motion intact. Vision grossly normal without  lenses Ears: External  ear exam reveals no significant lesions or deformities. Wax R canal > L; but hearing is grossly normal bilaterally. Nose: External nasal exam reveals no deformity or inflammation. Nasal mucosa are pink and moist. No lesions or exudates noted.   Mouth: Oral mucosa and oropharynx reveal no lesions or exudates. Teeth in good repair. Neck: No deformities, masses, or tenderness noted. Range of motion &Thyroid  normal. Lungs: Normal respiratory effort; chest expands symmetrically. Lungs are clear to auscultation without rales, wheezes, or increased work of breathing. Heart: Normal rate and rhythm. Normal S1 and S2. No gallop, click, or rub. Apical click w/o murmur. Abdomen: Bowel sounds normal; abdomen soft and nontender. No masses, organomegaly or hernias noted. Genitalia: Genitalia normal except for left varices. Prostate exam deferred. Musculoskeletal/extremities: No deformity or scoliosis noted of  the thoracic or lumbar spine.  No clubbing, cyanosis, edema, or significant extremity  deformity noted. Range of motion normal .Tone & strength  Normal. Joints  reveal minor PIP fusiform  changes. Nail health good. Able to lie down & sit up  w/o help. Negative SLR bilaterally Vascular: Carotid, radial artery, dorsalis pedis and  posterior tibial pulses are full and equal. No bruits present. Neurologic: Alert and oriented x3. Deep tendon reflexes symmetrical and normal.         Skin: Intact without suspicious lesions or rashes. Lymph: No cervical, axillary, or inguinal lymphadenopathy present. Psych: Mood and affect are normal. Normally interactive                                                                                       Assessment & Plan:  #1 comprehensive physical exam; no acute findings  Plan: see Orders  & Recommendations

## 2013-01-10 NOTE — Patient Instructions (Addendum)
Preventive Health Care: Exercise at least 30-45 minutes a day,  3-4 days a week.  Eat a low-fat diet with lots of fruits and vegetables, up to 7-9 servings per day. This would eliminate the need for vitamin supplements. Consume less than 40 grams of sugar (preferably ZERO) per day from foods & drinks with High Fructose Corn Sugar as #1,2,3 or # 4 on label. Safe sex - if you may be exposed to STDs, use a condom. Monthly self genital exam recommended.

## 2013-01-11 LAB — HEPATIC FUNCTION PANEL
ALT: 23 U/L (ref 0–53)
AST: 20 U/L (ref 0–37)
Albumin: 4.5 g/dL (ref 3.5–5.2)
Alkaline Phosphatase: 66 U/L (ref 39–117)
Bilirubin, Direct: 0.1 mg/dL (ref 0.0–0.3)
Indirect Bilirubin: 0.6 mg/dL (ref 0.0–0.9)
Total Bilirubin: 0.7 mg/dL (ref 0.3–1.2)
Total Protein: 6.9 g/dL (ref 6.0–8.3)

## 2013-01-11 LAB — TSH: TSH: 0.624 u[IU]/mL (ref 0.350–4.500)

## 2013-01-11 LAB — LIPID PANEL
LDL Cholesterol: 102 mg/dL — ABNORMAL HIGH (ref 0–99)
Triglycerides: 45 mg/dL (ref <150)
VLDL: 9 mg/dL (ref 0–40)

## 2013-01-11 LAB — BASIC METABOLIC PANEL
CO2: 23 mEq/L (ref 19–32)
Chloride: 103 mEq/L (ref 96–112)
Sodium: 138 mEq/L (ref 135–145)

## 2013-05-01 ENCOUNTER — Other Ambulatory Visit: Payer: Self-pay

## 2013-05-05 ENCOUNTER — Ambulatory Visit: Payer: BC Managed Care – PPO | Admitting: Internal Medicine

## 2014-08-28 ENCOUNTER — Encounter: Payer: Self-pay | Admitting: Internal Medicine

## 2014-08-28 ENCOUNTER — Ambulatory Visit (INDEPENDENT_AMBULATORY_CARE_PROVIDER_SITE_OTHER): Payer: BC Managed Care – PPO | Admitting: Internal Medicine

## 2014-08-28 VITALS — BP 110/70 | HR 51 | Temp 97.9°F | Ht 70.0 in | Wt 164.1 lb

## 2014-08-28 DIAGNOSIS — H6123 Impacted cerumen, bilateral: Secondary | ICD-10-CM

## 2014-08-28 DIAGNOSIS — R2689 Other abnormalities of gait and mobility: Secondary | ICD-10-CM

## 2014-08-28 NOTE — Patient Instructions (Addendum)
Please do not use Q-tips ;this simply packs the wax further into the ear canal.  Should wax build up occur, please put 2-3 drops of mineral oil in the affected  ear at night to soften the wax .Cover the canal with a  cotton ball to prevent the oil from staining bed linens. In the morning fill the ear canal with hydrogen peroxide & lie in the opposite lateral decubitus position(on the side opposite the affected ear)  for 10-15 minutes. After allowing this period of time for the peroxide to dissolve the wax ;shower and use the thinnest washrag available to wick out the wax. If both ears are involved ; alternate this treatment from ear to ear each night until no wax is found on the washrag. For allergic symptoms: Flonase OR Nasacort AQ 1 spray in each nostril twice a day as needed. Use the "crossover" technique into opposite nostril spraying toward opposite ear @ 45 degree angle, not straight up into nostril.  Plain Allegra (NOT D )  160 daily , Loratidine 10 mg , OR Zyrtec 10 mg @ bedtime  as needed for itchy eyes & sneezing.

## 2014-08-28 NOTE — Progress Notes (Signed)
   Subjective:    Patient ID: Edward Lin, male    DOB: 1986/02/15, 29 y.o.   MRN: [id number]  HPI  He's had some disequilibrium in the mornings for the last 2 weeks. He has also noted that his hearing is muffled intermittently. This alternates from ear to ear. His wife has noted decreased hearing.  Last week he did have itchy, watery eyes, and sneezing. He's had no upper respiratory tract  symptoms otherwise.  Review of Systems Frontal headache, facial pain , nasal purulence, dental pain, sore throat , otic pain or otic discharge denied. No fever , chills or sweats.    Objective:   Physical Exam   Pertinent or positive findings include : He has dense cerumen impactions bilaterally.  Whisper at 6 feet is decreased.  Romberg and finger-nose testing is negative.  General appearance:thin but adequately nourished; no acute distress or increased work of breathing is present.  No  lymphadenopathy about the head, neck, or axilla noted.  Eyes: No conjunctival inflammation or lid edema is present. There is no scleral icterus. Ears:  External ear exam shows no significant lesions or deformities.   Nose:  External nasal examination shows no deformity or inflammation. Nasal mucosa are pink and moist without lesions or exudates. No septal dislocation or deviation.No obstruction to airflow.  Oral exam: Dental hygiene is good; lips and gums are healthy appearing.There is no oropharyngeal erythema or exudate noted.  Neck:  No deformities, thyromegaly, masses, or tenderness noted.   Supple with full range of motion without pain.  Heart:  Normal rate and regular rhythm. S1 and S2 normal without gallop, murmur, click, rub or other extra sounds.  Lungs:Chest clear to auscultation; no wheezes, rhonchi,rales ,or rubs present. Extremities:  No cyanosis, edema, or clubbing  noted  Skin: Warm & dry w/o jaundice or tenting.      Assessment & Plan:  #1 bilateral cerumen impactions with disequilibrium and  hearing loss.  Plan: Cerumen removal Whisper intact post removal of impactions  Long-term cerumen protocol as prevention

## 2014-08-28 NOTE — Progress Notes (Signed)
Pre visit review using our clinic review tool, if applicable. No additional management support is needed unless otherwise documented below in the visit note. 

## 2014-10-14 ENCOUNTER — Encounter: Payer: Self-pay | Admitting: Internal Medicine

## 2014-10-14 ENCOUNTER — Ambulatory Visit (INDEPENDENT_AMBULATORY_CARE_PROVIDER_SITE_OTHER): Payer: BC Managed Care – PPO | Admitting: Internal Medicine

## 2014-10-14 VITALS — BP 114/72 | HR 47 | Temp 98.4°F | Ht 70.0 in | Wt 166.8 lb

## 2014-10-14 DIAGNOSIS — IMO0001 Reserved for inherently not codable concepts without codable children: Secondary | ICD-10-CM

## 2014-10-14 DIAGNOSIS — R69 Illness, unspecified: Secondary | ICD-10-CM

## 2014-10-14 NOTE — Progress Notes (Signed)
Pre visit review using our clinic review tool, if applicable. No additional management support is needed unless otherwise documented below in the visit note. 

## 2014-10-14 NOTE — Progress Notes (Signed)
   Subjective:    Patient ID: Edward Lin, male    DOB: 1985/07/12, 29 y.o.   MRN: [id number]  HPI He is here for a Transderm scope patch. He has no symptoms whatsoever. He will be gone on a cruise for 3 days.   Review of Systems     Objective:   Physical Exam        Assessment & Plan:  Hand written prescription provided. No charge for office visit.

## 2015-03-17 ENCOUNTER — Encounter: Payer: Self-pay | Admitting: Internal Medicine

## 2015-03-17 ENCOUNTER — Other Ambulatory Visit (INDEPENDENT_AMBULATORY_CARE_PROVIDER_SITE_OTHER): Payer: BC Managed Care – PPO

## 2015-03-17 ENCOUNTER — Ambulatory Visit (INDEPENDENT_AMBULATORY_CARE_PROVIDER_SITE_OTHER): Payer: BC Managed Care – PPO | Admitting: Internal Medicine

## 2015-03-17 VITALS — BP 110/74 | HR 56 | Temp 97.9°F | Resp 16 | Wt 157.0 lb

## 2015-03-17 DIAGNOSIS — F988 Other specified behavioral and emotional disorders with onset usually occurring in childhood and adolescence: Secondary | ICD-10-CM

## 2015-03-17 DIAGNOSIS — F909 Attention-deficit hyperactivity disorder, unspecified type: Secondary | ICD-10-CM | POA: Diagnosis not present

## 2015-03-17 DIAGNOSIS — Z23 Encounter for immunization: Secondary | ICD-10-CM

## 2015-03-17 LAB — TSH: TSH: 0.43 u[IU]/mL (ref 0.35–4.50)

## 2015-03-17 MED ORDER — AMPHETAMINE-DEXTROAMPHETAMINE 10 MG PO TABS: 10.0000 mg | ORAL_TABLET | Freq: Two times a day (BID) | ORAL | Status: DC

## 2015-03-17 NOTE — Progress Notes (Signed)
   Subjective:    Patient ID: Edward Lin, male    DOB: 15-Sep-1985, 29 y.o.   MRN: [id number]  HPI He questions whether he has ADD as he is having increasing issues related to focus and retention the last 2-3 years. This is manifested as difficulty focusing on conversations and retaining the content. He also has decreased memory which he relates to not focusing. He states he must write down or enter tasks he has to complete into his phone files.  From the first to the 12 th grade he did have some reading comprehension issues.In the ninth grade he was on Ritalin for this but discontinued it after he graduated  not wanting to be on maintenance medicine  It took 4 years to complete [employer] which is normally a 2 year program . This was related to changing majors. 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  He plans to start a diesel program and is concerned he would not be able to concentrate.   TSH was 0.624 in 07/14.  His father may have had ADD.   Review of Systems He was evaluated for palpitations in 2014. Echocardiogram was normal. An apical click has been suggested without regurgitation in the past.  No significant sleep disorder; change in appetite;weight change. No blurred, double ,loss of vision No constipation; diarrhea;hoarseness. No change in nails,skin or skin No numbness or tingling; tremor No anxiety; depression; panic attacks No temperature intolerance to heat ,cold     Objective:   Physical Exam  Gen.:  Thin but adequately nourished; in no acute distress. He has a Engineering geologist. Eyes: Extraocular motion intact; no lid lag , proptosis , or nystagmus Neck: full ROM; no masses ; thyroid normal  Heart: Normal rhythm and rate without significant murmur, gallop, or extra heart sounds Lungs: Chest clear to auscultation without rales,rales, wheezes Neuro:Deep tendon reflexes are equal and within normal limits; no tremor    Skin/Nails: Warm and dry without significant lesions or rashes; no onycholysis Lymphatic: no cervical or axillary LA Psych: Normally communicative and interactive; no abnormal mood or affect clinically.         Assessment & Plan:  #1 ADD  Past history of weight loss with Ritalin  Plan: Trial of Adderall.

## 2015-03-17 NOTE — Progress Notes (Signed)
Pre visit review using our clinic review tool, if applicable. No additional management support is needed unless otherwise documented below in the visit note. 

## 2015-03-17 NOTE — Patient Instructions (Signed)
  Your next office appointment will be determined based upon review of your pending labs  and  xrays  Those written interpretation of the lab results and instructions will be transmitted to you by My Chart  Followup as needed for any active or acute issue. Please report any significant change in your symptoms.

## 2015-06-10 ENCOUNTER — Telehealth: Payer: Self-pay | Admitting: *Deleted

## 2015-06-10 DIAGNOSIS — F988 Other specified behavioral and emotional disorders with onset usually occurring in childhood and adolescence: Secondary | ICD-10-CM

## 2015-06-10 MED ORDER — AMPHETAMINE-DEXTROAMPHETAMINE 10 MG PO TABS: 10.0000 mg | ORAL_TABLET | Freq: Two times a day (BID) | ORAL | Status: DC

## 2015-06-10 NOTE — Telephone Encounter (Signed)
OK X 2 months (post date 2nd Rx please) Needs new PCP after that

## 2015-06-10 NOTE — Telephone Encounter (Signed)
Left msg on triage requesting refill on his Adderal.../lmb

## 2015-06-10 NOTE — Telephone Encounter (Signed)
Notified pt rx's ready for pick-up../lmb 

## 2015-07-15 ENCOUNTER — Telehealth: Payer: Self-pay

## 2015-07-15 NOTE — Telephone Encounter (Signed)
PA started: Key - [id number]  PA came back that pt is not active.   LVM for pt to call back as soon as possible.  RE: need updated insurance information.

## 2015-07-15 NOTE — Telephone Encounter (Signed)
Pt called back. Gave fax number to send new insurance card to.  Pt stated he will send asap.

## 2015-07-16 ENCOUNTER — Telehealth: Payer: Self-pay

## 2015-07-16 NOTE — Telephone Encounter (Signed)
PA approved.   LVM for pt to call back as soon as possible.  RE: pa approved  Faxed approval to pharmacy.

## 2015-07-16 NOTE — Telephone Encounter (Signed)
PA started.  KEY: [id number]

## 2015-07-22 ENCOUNTER — Ambulatory Visit: Payer: BC Managed Care – PPO | Admitting: Family

## 2015-09-30 ENCOUNTER — Encounter: Payer: Self-pay | Admitting: Family

## 2015-09-30 ENCOUNTER — Ambulatory Visit: Payer: BC Managed Care – PPO | Admitting: Family

## 2015-09-30 ENCOUNTER — Ambulatory Visit (INDEPENDENT_AMBULATORY_CARE_PROVIDER_SITE_OTHER): Payer: BC Managed Care – PPO | Admitting: Family

## 2015-09-30 VITALS — BP 108/64 | HR 70 | Temp 97.6°F | Resp 16 | Ht 70.0 in | Wt 158.0 lb

## 2015-09-30 DIAGNOSIS — F988 Other specified behavioral and emotional disorders with onset usually occurring in childhood and adolescence: Secondary | ICD-10-CM

## 2015-09-30 DIAGNOSIS — F909 Attention-deficit hyperactivity disorder, unspecified type: Secondary | ICD-10-CM | POA: Diagnosis not present

## 2015-09-30 MED ORDER — AMPHETAMINE-DEXTROAMPHETAMINE 10 MG PO TABS: 10.0000 mg | ORAL_TABLET | Freq: Two times a day (BID) | ORAL | Status: DC

## 2015-09-30 NOTE — Patient Instructions (Signed)
Thank you for choosing [facility].  Summary/Instructions:  Please continue to take your medication as prescribed.   Your prescription(s) have been submitted to your pharmacy or been printed and provided for you. Please take as directed and contact our office if you believe you are having problem(s) with the medication(s) or have any questions.   

## 2015-09-30 NOTE — Assessment & Plan Note (Signed)
ADD is stable with current regimen and symptoms are adequately managed with no adverse side effects. [place] controlled substance database reviewed with no irregularities. Continue current dosage of Adderall. Follow-up in 3 months or sooner if needed.

## 2015-09-30 NOTE — Progress Notes (Deleted)
Subjective:     Patient ID: Edward Lin, male   DOB: 1986/01/12, 30 y.o.   MRN: [id number]  HPI  ADD: Presents today for Adderall refill. States symptoms maintained at current dose. Denies chest pain,  shortness of breath, headache, change in appetite. Sleeping 7 hours each night.   Review of Systems  Constitutional: Negative.   Respiratory: Negative.  Negative for chest tightness and shortness of breath.   Cardiovascular: Negative.  Negative for chest pain.       Objective:   Physical Exam  Constitutional: He appears well-developed and well-nourished.  Cardiovascular: Normal rate, regular rhythm and normal heart sounds.   Pulmonary/Chest: Effort normal and breath sounds normal.       Assessment:     ***    Plan:     ***

## 2015-09-30 NOTE — Progress Notes (Signed)
Subjective:    Patient ID: Edward Lin, male    DOB: 17-Mar-1986, 30 y.o.   MRN: [id number]  Chief Complaint  Patient presents with  . Establish Care    Refill of adderall    HPI:  Edward Lin is a 30 y.o. male who  has a past medical history of Reye syndrome; Heart palpitations (01/2011 ); and Syncope. and presents today For a follow-up office visit.  ADD - currently maintained on Adderall. Reports taking the medication as prescribed with no adverse side effects. Sleeping approximate 7 hours per night. No significant weight changes or changes in appetite. Denies chest pain, shortness of breath, or heart palpitations. Indicates his symptoms are adequately controlled with current regimen.  Wt Readings from Last 3 Encounters:  09/30/15 158 lb (71.668 kg)  03/17/15 157 lb (71.215 kg)  10/14/14 166 lb 12 oz (75.637 kg)    Allergies  Allergen Reactions  . Penicillins     Rash   . Ritalin [Methylphenidate Hcl]     Weight loss    No current outpatient prescriptions on file prior to visit.   No current facility-administered medications on file prior to visit.     Review of Systems  Constitutional: Negative for diaphoresis, appetite change and unexpected weight change.  Respiratory: Negative for chest tightness and shortness of breath.   Cardiovascular: Negative for chest pain, palpitations and leg swelling.  Neurological: Negative for dizziness and headaches.  Psychiatric/Behavioral: Negative for sleep disturbance and decreased concentration. The patient is not nervous/anxious.       Objective:    BP 108/64 mmHg  Pulse 70  Temp(Src) 97.6 F (36.4 C) (Oral)  Resp 16  Ht 5\' 10"  (1.778 m)  Wt 158 lb (71.668 kg)  BMI 22.67 kg/m2  SpO2 98% Nursing note and vital signs reviewed.  Physical Exam  Constitutional: He is oriented to person, place, and time. He appears well-developed and well-nourished. No distress.  Cardiovascular: Normal rate, regular rhythm,  normal heart sounds and intact distal pulses.   Pulmonary/Chest: Effort normal and breath sounds normal.  Neurological: He is alert and oriented to person, place, and time.  Skin: Skin is warm and dry.  Psychiatric: He has a normal mood and affect. His behavior is normal. Judgment and thought content normal.       Assessment & Plan:   Problem List Items Addressed This Visit      Other   ADD (attention deficit disorder) - Primary    ADD is stable with current regimen and symptoms are adequately managed with no adverse side effects. [place] controlled substance database reviewed with no irregularities. Continue current dosage of Adderall. Follow-up in 3 months or sooner if needed.      Relevant Medications   amphetamine-dextroamphetamine (ADDERALL) 10 MG tablet      I am having Mr. Desimone maintain his amphetamine-dextroamphetamine.   Meds ordered this encounter  Medications  . DISCONTD: amphetamine-dextroamphetamine (ADDERALL) 10 MG tablet    Sig: Take 1 tablet (10 mg total) by mouth 2 (two) times daily.    Dispense:  60 tablet    Refill:  0    Order Specific Question:  Supervising Provider    Answer:  Pricilla Holm A [id number]  . DISCONTD: amphetamine-dextroamphetamine (ADDERALL) 10 MG tablet    Sig: Take 1 tablet (10 mg total) by mouth 2 (two) times daily.    Dispense:  60 tablet    Refill:  0    Do not fill until  10/30/15    Order Specific Question:  Supervising Provider    Answer:  Pricilla Holm A [id number]  . amphetamine-dextroamphetamine (ADDERALL) 10 MG tablet    Sig: Take 1 tablet (10 mg total) by mouth 2 (two) times daily.    Dispense:  60 tablet    Refill:  0    Do not fill until 11/30/15    Order Specific Question:  Supervising Provider    Answer:  Pricilla Holm A [id number]     Follow-up: Return in about 3 months (around 12/30/2015).  Mauricio Po, FNP

## 2015-09-30 NOTE — Progress Notes (Signed)
Pre visit review using our clinic review tool, if applicable. No additional management support is needed unless otherwise documented below in the visit note. 

## 2015-11-04 ENCOUNTER — Ambulatory Visit: Payer: BC Managed Care – PPO | Admitting: Family

## 2015-11-04 ENCOUNTER — Ambulatory Visit (INDEPENDENT_AMBULATORY_CARE_PROVIDER_SITE_OTHER): Payer: BC Managed Care – PPO | Admitting: Family Medicine

## 2015-11-04 ENCOUNTER — Encounter: Payer: Self-pay | Admitting: Family Medicine

## 2015-11-04 VITALS — BP 108/70 | HR 64 | Temp 98.5°F | Resp 12 | Ht 70.0 in | Wt 163.0 lb

## 2015-11-04 DIAGNOSIS — L309 Dermatitis, unspecified: Secondary | ICD-10-CM | POA: Diagnosis not present

## 2015-11-04 DIAGNOSIS — L298 Other pruritus: Secondary | ICD-10-CM

## 2015-11-04 MED ORDER — CLOBETASOL PROPIONATE 0.05 % EX CREA: 1.0000 "application " | TOPICAL_CREAM | Freq: Two times a day (BID) | CUTANEOUS | Status: DC

## 2015-11-04 NOTE — Patient Instructions (Signed)
A few things to remember from today's visit:   1. Pruritic erythematous rash   2. Acute eczema  Daily Zyrtec 10 mg daily. Steroid cream small amount at the time.      If you sign-up for My chart, you can communicate easier with [place] in case you have any question or concern.

## 2015-11-04 NOTE — Progress Notes (Signed)
Pre visit review using our clinic review tool, if applicable. No additional management support is needed unless otherwise documented below in the visit note. 

## 2015-11-04 NOTE — Progress Notes (Signed)
Subjective:    Patient ID: Edward Lin, male    DOB: 1986-05-21, 30 y.o.   MRN: [id number]  HPI   Edward Lin is a 30 y.o.male here today c/o a week or so of pruritic rash on axilla.  Rash started while he was on the beach, around the 3rd day, attributed to detergent/chlorid hotel uses. He has had similar rash in the past while staying in a hotel on the beach, he states that he has "sensitive skin", not sure about Hx of exzema. He also had lesions on abdomen and trunk, these have resolved. He denies any new medication, soap, or body product. Possible new detergent exposure when staying in the hotel.  No known insect bite or outdoor exposures to plants. No sick contact.  He has tried OTC Cortisone and another cream his wife applied on, not sure about name, he thinks it is a prescription topical medication. He denies oral lesions/edema,cough, wheezing, dyspnea, abdominal pain, nausea, or vomiting. Today rash seems to be better today.   Review of Systems  Constitutional: Negative for fever, activity change, appetite change, fatigue and unexpected weight change.  HENT: Negative for facial swelling, mouth sores, nosebleeds and trouble swallowing.   Eyes: Negative for discharge, redness, itching and visual disturbance.  Respiratory: Negative for cough, shortness of breath and wheezing.   Cardiovascular: Negative for palpitations and leg swelling.  Gastrointestinal: Negative for nausea, vomiting, abdominal pain and diarrhea.  Genitourinary: Negative for dysuria, hematuria and decreased urine volume.  Musculoskeletal: Negative for myalgias, back pain, joint swelling and arthralgias.  Skin: Positive for rash.  Allergic/Immunologic: Positive for environmental allergies. Negative for food allergies.  Neurological: Negative for weakness and headaches.     Current Outpatient Prescriptions on File Prior to Visit  Medication Sig Dispense Refill  . amphetamine-dextroamphetamine  (ADDERALL) 10 MG tablet Take 1 tablet (10 mg total) by mouth 2 (two) times daily. 60 tablet 0   No current facility-administered medications on file prior to visit.     Past Medical History  Diagnosis Date  . Reye syndrome   . Heart palpitations 01/2011     negative evaluation , Dr Stanford Breed  . Syncope     X1 with venopuncture (vasovagal reaction)    Social History   Social History  . Marital Status: Married    Spouse Name: N/A  . Number of Children: N/A  . Years of Education: N/A   Social History Main Topics  . Smoking status: Never Smoker   . Smokeless tobacco: None  . Alcohol Use: Yes     Comment:  Rarely  . Drug Use: No  . Sexual Activity: Not Asked   Other Topics Concern  . None   Social History Narrative    Filed Vitals:   11/04/15 1507  BP: 108/70  Pulse: 64  Temp: 98.5 F (36.9 C)  Resp: 12   Body mass index is 23.39 kg/(m^2).      Objective:   Physical Exam  Constitutional: He is oriented to person, place, and time. He appears well-developed and well-nourished. He does not appear ill. No distress.  HENT:  Head: Atraumatic.  Mouth/Throat: Oropharynx is clear and moist and mucous membranes are normal.  Eyes: Conjunctivae are normal.  Neck: Normal range of motion.  Pulmonary/Chest: Effort normal and breath sounds normal. He has no wheezes. He has no rhonchi. He has no rales.  Musculoskeletal:  No signs of synovitis or significant joint deformity appreciated.  Lymphadenopathy:    He  has no cervical adenopathy.    He has no axillary adenopathy.  Neurological: He is alert and oriented to person, place, and time. He has normal strength. Coordination and gait normal.  Skin: Skin is warm. Rash noted. Rash is maculopapular. There is erythema.  On peri-axillary area erythematous, maculopapular rash, not tender.   Psychiatric: He has a normal mood and affect.  Well groomed and good eye contact.  Nursing note and vitals reviewed.      Assessment &  Plan:    Edward Lin was seen today for rash.  Diagnoses and all orders for this visit:  Pruritic erythematous rash -     clobetasol cream (TEMOVATE) 0.05 %; Apply 1 application topically 2 (two) times daily. Daily for up to 14 days.  Acute eczema -     clobetasol cream (TEMOVATE) 0.05 %; Apply 1 application topically 2 (two) times daily. Daily for up to 14 days.   Possible causes discussed, given his Hx of prior episodes I am thinking about eczema. Other possible causes discussed: contact dermatitis/photosensitivity given symmetric distribution and pattern. Since lesions are localized I think topical treatment is adequate, Clobetasol for 14 days recommended.Some side effects of topical steroids discussed. Daily Zyrtec 10 mg daily. Instructed about warning signs and f/u as needed.    -Patient advised to return or notify a doctor immediately if symptoms worsen or persist or new concerns arise.      Betty G. Martinique, MD  [facility] [place] office.

## 2016-12-18 ENCOUNTER — Ambulatory Visit: Payer: BC Managed Care – PPO | Admitting: Family

## 2016-12-22 ENCOUNTER — Encounter: Payer: BC Managed Care – PPO | Admitting: Family

## 2017-01-02 ENCOUNTER — Encounter: Payer: Self-pay | Admitting: Family

## 2017-01-02 ENCOUNTER — Ambulatory Visit (INDEPENDENT_AMBULATORY_CARE_PROVIDER_SITE_OTHER): Payer: BC Managed Care – PPO | Admitting: Family

## 2017-01-02 VITALS — BP 114/68 | HR 61 | Temp 98.5°F | Resp 16 | Ht 70.0 in | Wt 160.0 lb

## 2017-01-02 DIAGNOSIS — Z0001 Encounter for general adult medical examination with abnormal findings: Secondary | ICD-10-CM | POA: Insufficient documentation

## 2017-01-02 DIAGNOSIS — F988 Other specified behavioral and emotional disorders with onset usually occurring in childhood and adolescence: Secondary | ICD-10-CM

## 2017-01-02 DIAGNOSIS — Z0000 Encounter for general adult medical examination without abnormal findings: Secondary | ICD-10-CM | POA: Insufficient documentation

## 2017-01-02 MED ORDER — AMPHETAMINE-DEXTROAMPHETAMINE 5 MG PO TABS: 5.0000 mg | ORAL_TABLET | Freq: Two times a day (BID) | ORAL | 0 refills | Status: DC

## 2017-01-02 NOTE — Patient Instructions (Addendum)
Thank you for choosing [facility].  SUMMARY AND INSTRUCTIONS:  Medication:  Your prescription(s) have been submitted to your pharmacy or been printed and provided for you. Please take as directed and contact our office if you believe you are having problem(s) with the medication(s) or have any questions.  Labs:  Please stop by the lab on the lower level of the building for your blood work. Your results will be released to [facility] (or called to you) after review, usually within 72 hours after test completion. If any changes need to be made, you will be notified at that same time.  1.) The lab is open from 7:30am to 5:30 pm Monday-Friday 2.) No appointment is necessary 3.) Fasting (if needed) is 6-8 hours after food and drink; black coffee and water are okay   Follow up:  If your symptoms worsen or fail to improve, please contact our office for further instruction, or in case of emergency go directly to the emergency room at the closest medical facility.    Health Maintenance, Male A healthy lifestyle and preventive care is important for your health and wellness. Ask your health care provider about what schedule of regular examinations is right for you. What should I know about weight and diet? Eat a Healthy Diet  Eat plenty of vegetables, fruits, whole grains, low-fat dairy products, and lean protein.  Do not eat a lot of foods high in solid fats, added sugars, or salt.  Maintain a Healthy Weight Regular exercise can help you achieve or maintain a healthy weight. You should:  Do at least 150 minutes of exercise each week. The exercise should increase your heart rate and make you sweat (moderate-intensity exercise).  Do strength-training exercises at least twice a week.  Watch Your Levels of Cholesterol and Blood Lipids  Have your blood tested for lipids and cholesterol every 5 years starting at 31 years of age. If you are at high risk for heart disease, you should start  having your blood tested when you are 31 years old. You may need to have your cholesterol levels checked more often if: ? Your lipid or cholesterol levels are high. ? You are older than 31 years of age. ? You are at high risk for heart disease.  What should I know about cancer screening? Many types of cancers can be detected early and may often be prevented. Lung Cancer  You should be screened every year for lung cancer if: ? You are a current smoker who has smoked for at least 30 years. ? You are a former smoker who has quit within the past 15 years.  Talk to your health care provider about your screening options, when you should start screening, and how often you should be screened.  Colorectal Cancer  Routine colorectal cancer screening usually begins at 31 years of age and should be repeated every 5-10 years until you are 31 years old. You may need to be screened more often if early forms of precancerous polyps or small growths are found. Your health care provider may recommend screening at an earlier age if you have risk factors for colon cancer.  Your health care provider may recommend using home test kits to check for hidden blood in the stool.  A small camera at the end of a tube can be used to examine your colon (sigmoidoscopy or colonoscopy). This checks for the earliest forms of colorectal cancer.  Prostate and Testicular Cancer  Depending on your age and overall health,  your health care provider may do certain tests to screen for prostate and testicular cancer.  Talk to your health care provider about any symptoms or concerns you have about testicular or prostate cancer.  Skin Cancer  Check your skin from head to toe regularly.  Tell your health care provider about any new moles or changes in moles, especially if: ? There is a change in a mole's size, shape, or color. ? You have a mole that is larger than a pencil eraser.  Always use sunscreen. Apply sunscreen  liberally and repeat throughout the day.  Protect yourself by wearing long sleeves, pants, a wide-brimmed hat, and sunglasses when outside.  What should I know about heart disease, diabetes, and high blood pressure?  If you are 23-58 years of age, have your blood pressure checked every 3-5 years. If you are 19 years of age or older, have your blood pressure checked every year. You should have your blood pressure measured twice-once when you are at a hospital or clinic, and once when you are not at a hospital or clinic. Record the average of the two measurements. To check your blood pressure when you are not at a hospital or clinic, you can use: ? An automated blood pressure machine at a pharmacy. ? A home blood pressure monitor.  Talk to your health care provider about your target blood pressure.  If you are between 60-56 years old, ask your health care provider if you should take aspirin to prevent heart disease.  Have regular diabetes screenings by checking your fasting blood sugar level. ? If you are at a normal weight and have a low risk for diabetes, have this test once every three years after the age of 37. ? If you are overweight and have a high risk for diabetes, consider being tested at a younger age or more often.  A one-time screening for abdominal aortic aneurysm (AAA) by ultrasound is recommended for men aged 15-75 years who are current or former smokers. What should I know about preventing infection? Hepatitis B If you have a higher risk for hepatitis B, you should be screened for this virus. Talk with your health care provider to find out if you are at risk for hepatitis B infection. Hepatitis C Blood testing is recommended for:  Everyone born from [birth year] through 1965.  Anyone with known risk factors for hepatitis C.  Sexually Transmitted Diseases (STDs)  You should be screened each year for STDs including gonorrhea and chlamydia if: ? You are sexually active and are  younger than 31 years of age. ? You are older than 31 years of age and your health care provider tells you that you are at risk for this type of infection. ? Your sexual activity has changed since you were last screened and you are at an increased risk for chlamydia or gonorrhea. Ask your health care provider if you are at risk.  Talk with your health care provider about whether you are at high risk of being infected with HIV. Your health care provider may recommend a prescription medicine to help prevent HIV infection.  What else can I do?  Schedule regular health, dental, and eye exams.  Stay current with your vaccines (immunizations).  Do not use any tobacco products, such as cigarettes, chewing tobacco, and e-cigarettes. If you need help quitting, ask your health care provider.  Limit alcohol intake to no more than 2 drinks per day. One drink equals 12 ounces of beer, 5 ounces  of wine, or 1 ounces of hard liquor.  Do not use street drugs.  Do not share needles.  Ask your health care provider for help if you need support or information about quitting drugs.  Tell your health care provider if you often feel depressed.  Tell your health care provider if you have ever been abused or do not feel safe at home. This information is not intended to replace advice given to you by your health care provider. Make sure you discuss any questions you have with your health care provider. Document Released: 12/09/2007 Document Revised: 02/09/2016 Document Reviewed: 03/16/2015 Elsevier Interactive Patient Education  [employer].

## 2017-01-02 NOTE — Assessment & Plan Note (Signed)
Not currently maintained on medication and noted to have decreased levels of concentration would like to restart medication. [place] controlled substance database reviewed with no irregularities. Reports sleeping and eating well with no cardiac side effects. Restart Adderall.

## 2017-01-02 NOTE — Assessment & Plan Note (Signed)
1) Anticipatory Guidance: Discussed importance of wearing a seatbelt while driving and not texting while driving; changing batteries in smoke detector at least once annually; wearing suntan lotion when outside; eating a balanced and moderate diet; getting physical activity at least 30 minutes per day.  2) Immunizations / Screenings / Labs:  All immunizations are up-to-date per recommendations. Due for a vision screen encouraged to be completed independently. All other screenings are up-to-date per recommendations. Obtain CBC, CMET, and lipid profile.    Overall well exam with minimal risk factors for cardiovascular disease. He is of good weight and eats a regular, balance, and varied nutritional intake. Recommend increasing physical activity to goal of 30 minutes of moderate level activity daily or approximately 10,000 steps per day. Continue other healthy lifestyle behaviors and choices. Follow-up prevention exam in 1 year. Follow-up office visit for chronic conditions.

## 2017-01-02 NOTE — Progress Notes (Signed)
Subjective:    Patient ID: Edward Lin, male    DOB: 1985/07/06, 31 y.o.   MRN: [id number]  Chief Complaint  Patient presents with  . CPE    not fasting    HPI:  Edward Lin is a 31 y.o. male who presents today for an annual wellness visit.   1) Health Maintenance -   Diet - Averages about 3-4 meals per day with snacks consisting of a regular diet; Caffeine intake 1-2 cups daily  Exercise - No structured exercise   2) Preventative Exams / Immunizations:  Dental -- Up to date   Vision -- Due for exam   Health Maintenance  Topic Date Due  . HIV Screening  04/28/2001  . INFLUENZA VACCINE  01/24/2017  . TETANUS/TDAP  03/16/2025    Immunization History  Administered Date(s) Administered  . Influenza Split 05/19/2011  . Influenza Whole 03/19/2008  . Influenza-Unspecified 04/09/2014, 03/15/2015  . Tdap 03/17/2015    3.) ADD - Previously prescribed Adderall. Reports he has not taken the medication in the past couple of months and has noted a decrease in his ability to concentrate. Sleeping about 6-8 hours per night. No significant weight loss. No chest pain, heart palpitations or shortness of breath. Would like to go down on the medication a little if possible.     Allergies  Allergen Reactions  . Penicillins     Rash   . Ritalin [Methylphenidate Hcl]     Weight loss     Outpatient Medications Prior to Visit  Medication Sig Dispense Refill  . clobetasol cream (TEMOVATE) 0.86 % Apply 1 application topically 2 (two) times daily. Daily for up to 14 days. 60 g 0  . amphetamine-dextroamphetamine (ADDERALL) 10 MG tablet Take 1 tablet (10 mg total) by mouth 2 (two) times daily. 60 tablet 0   No facility-administered medications prior to visit.      Past Medical History:  Diagnosis Date  . Heart palpitations 01/2011    negative evaluation , Dr Stanford Breed  . Reye syndrome   . Syncope    X1 with venopuncture (vasovagal reaction)     Past Surgical  History:  Procedure Laterality Date  . APPENDECTOMY    . WISDOM TOOTH EXTRACTION       Family History  Problem Relation Age of Onset  . Deep vein thrombosis Father        factor V deficiency  . Diabetes Paternal Grandfather   . Cancer Maternal Grandfather        ? prostate  . Cancer Maternal Uncle        esophageal  . Heart attack Paternal Uncle 44  . Stroke Neg Hx      Social History   Social History  . Marital status: Married    Spouse name: N/A  . Number of children: 1  . Years of education: 39   Occupational History  . Not on file.   Social History Main Topics  . Smoking status: Never Smoker  . Smokeless tobacco: Never Used  . Alcohol use Yes     Comment:  Rarely  . Drug use: No  . Sexual activity: Not on file   Other Topics Concern  . Not on file   Social History Narrative   Fun/Hobby: Sleep, Snowboard       Review of Systems  Constitutional: Denies fever, chills, fatigue, or significant weight gain/loss. HENT: Head: Denies headache or neck pain Ears: Denies changes in hearing, ringing in ears,  earache, drainage Nose: Denies discharge, stuffiness, itching, nosebleed, sinus pain Throat: Denies sore throat, hoarseness, dry mouth, sores, thrush Eyes: Denies loss/changes in vision, pain, redness, blurry/double vision, flashing lights Cardiovascular: Denies chest pain/discomfort, tightness, palpitations, shortness of breath with activity, difficulty lying down, swelling, sudden awakening with shortness of breath Respiratory: Denies shortness of breath, cough, sputum production, wheezing Gastrointestinal: Denies dysphasia, heartburn, change in appetite, nausea, change in bowel habits, rectal bleeding, constipation, diarrhea, yellow skin or eyes Genitourinary: Denies frequency, urgency, burning/pain, blood in urine, incontinence, change in urinary strength. Musculoskeletal: Denies muscle/joint pain, stiffness, back pain, redness or swelling of joints,  trauma Skin: Denies rashes, lumps, itching, dryness, color changes, or hair/nail changes Neurological: Denies dizziness, fainting, seizures, weakness, numbness, tingling, tremor Psychiatric - Denies nervousness, stress, depression or memory loss Positive for decreased attention and concentration Endocrine: Denies heat or cold intolerance, sweating, frequent urination, excessive thirst, changes in appetite Hematologic: Denies ease of bruising or bleeding     Objective:     BP 114/68 (BP Location: Left Arm, Patient Position: Sitting, Cuff Size: Normal)   Pulse 61   Temp 98.5 F (36.9 C) (Oral)   Resp 16   Ht 5\' 10"  (1.778 m)   Wt 160 lb (72.6 kg)   SpO2 98%   BMI 22.96 kg/m  Nursing note and vital signs reviewed.  Physical Exam  Constitutional: He is oriented to person, place, and time. He appears well-developed and well-nourished.  HENT:  Head: Normocephalic.  Right Ear: Hearing, tympanic membrane, external ear and ear canal normal.  Left Ear: Hearing, tympanic membrane, external ear and ear canal normal.  Nose: Nose normal.  Mouth/Throat: Uvula is midline, oropharynx is clear and moist and mucous membranes are normal.  Eyes: Conjunctivae and EOM are normal. Pupils are equal, round, and reactive to light.  Neck: Neck supple. No JVD present. No tracheal deviation present. No thyromegaly present.  Cardiovascular: Normal rate, regular rhythm, normal heart sounds and intact distal pulses.   Pulmonary/Chest: Effort normal and breath sounds normal.  Abdominal: Soft. Bowel sounds are normal. He exhibits no distension and no mass. There is no tenderness. There is no rebound and no guarding.  Musculoskeletal: Normal range of motion. He exhibits no edema or tenderness.  Lymphadenopathy:    He has no cervical adenopathy.  Neurological: He is alert and oriented to person, place, and time. He has normal reflexes. No cranial nerve deficit. He exhibits normal muscle tone. Coordination normal.   Skin: Skin is warm and dry.  Psychiatric: He has a normal mood and affect. His behavior is normal. Judgment and thought content normal.       Assessment & Plan:   Problem List Items Addressed This Visit      Other   ADD (attention deficit disorder)    Not currently maintained on medication and noted to have decreased levels of concentration would like to restart medication. [place] controlled substance database reviewed with no irregularities. Reports sleeping and eating well with no cardiac side effects. Restart Adderall.      Encounter for general adult medical examination with abnormal findings - Primary    1) Anticipatory Guidance: Discussed importance of wearing a seatbelt while driving and not texting while driving; changing batteries in smoke detector at least once annually; wearing suntan lotion when outside; eating a balanced and moderate diet; getting physical activity at least 30 minutes per day.  2) Immunizations / Screenings / Labs:  All immunizations are up-to-date per recommendations. Due for a vision  screen encouraged to be completed independently. All other screenings are up-to-date per recommendations. Obtain CBC, CMET, and lipid profile.    Overall well exam with minimal risk factors for cardiovascular disease. He is of good weight and eats a regular, balance, and varied nutritional intake. Recommend increasing physical activity to goal of 30 minutes of moderate level activity daily or approximately 10,000 steps per day. Continue other healthy lifestyle behaviors and choices. Follow-up prevention exam in 1 year. Follow-up office visit for chronic conditions.       Relevant Orders   CBC   Comprehensive metabolic panel   Lipid panel       I am having Mr. Crite maintain his clobetasol cream and amphetamine-dextroamphetamine.   Meds ordered this encounter  Medications  . DISCONTD: amphetamine-dextroamphetamine (ADDERALL) 5 MG tablet    Sig: Take 1  tablet (5 mg total) by mouth 2 (two) times daily.    Dispense:  60 tablet    Refill:  0    Order Specific Question:   Supervising Provider    Answer:   Pricilla Holm A [[id number]]  . DISCONTD: amphetamine-dextroamphetamine (ADDERALL) 5 MG tablet    Sig: Take 1 tablet (5 mg total) by mouth 2 (two) times daily.    Dispense:  60 tablet    Refill:  0    Fill on or after 02/01/17    Order Specific Question:   Supervising Provider    Answer:   Pricilla Holm A [[id number]]  . amphetamine-dextroamphetamine (ADDERALL) 5 MG tablet    Sig: Take 1 tablet (5 mg total) by mouth 2 (two) times daily.    Dispense:  60 tablet    Refill:  0    Fill on or after 03/03/17    Order Specific Question:   Supervising Provider    Answer:   Pricilla Holm A [[id number]]     Follow-up: Return in about 3 months (around 04/04/2017), or if symptoms worsen or fail to improve.   Mauricio Po, FNP

## 2017-07-11 ENCOUNTER — Ambulatory Visit: Payer: BC Managed Care – PPO | Admitting: Family Medicine

## 2017-07-12 ENCOUNTER — Encounter: Payer: Self-pay | Admitting: Family Medicine

## 2017-07-12 ENCOUNTER — Ambulatory Visit: Payer: BC Managed Care – PPO | Admitting: Family Medicine

## 2017-07-12 VITALS — BP 102/70 | HR 77 | Ht 70.0 in | Wt 161.2 lb

## 2017-07-12 DIAGNOSIS — F988 Other specified behavioral and emotional disorders with onset usually occurring in childhood and adolescence: Secondary | ICD-10-CM | POA: Diagnosis not present

## 2017-07-12 MED ORDER — AMPHETAMINE-DEXTROAMPHETAMINE 5 MG PO TABS: 5.0000 mg | ORAL_TABLET | Freq: Two times a day (BID) | ORAL | 0 refills | Status: DC

## 2017-07-12 NOTE — Progress Notes (Signed)
Subjective:  Patient ID: Edward Lin, male    DOB: Aug 01, 1985  Age: 32 y.o. MRN: [id number]  CC: Establish Care   HPI [name] presents for establishment of care and treatment ADD.  He has a standing history of this issue since high school.  He uses the medics exclusively for focus.  He rarely if ever uses the p.m. dose.  He has no history history of heart disease, anxiety.  He does have a history of palpitations that were associated with a muscle building supplement.  Cardiac workup in the past has been negative for this.  Echocardiogram in 2012 was normal.  His lipid profile has been normal.  He rarely drinks alcohol and has no more than 1 or 2 cups of coffee per day.  He does not use illicit drugs.  He works as a Dealer.  He is married and has 31-year-old son.  Patient fasting blood work taken in July was normal.  The 10 mg form of the drug makes him jittery. Outpatient Medications Prior to Visit  Medication Sig Dispense Refill  . amphetamine-dextroamphetamine (ADDERALL) 5 MG tablet Take 1 tablet (5 mg total) by mouth 2 (two) times daily. 60 tablet 0  . clobetasol cream (TEMOVATE) 3.71 % Apply 1 application topically 2 (two) times daily. Daily for up to 14 days. 60 g 0   No facility-administered medications prior to visit.     ROS Review of Systems  Constitutional: Negative for diaphoresis, fatigue, fever and unexpected weight change.  HENT: Negative.   Eyes: Negative.   Respiratory: Negative.  Negative for chest tightness, shortness of breath and wheezing.   Cardiovascular: Negative for chest pain, palpitations and leg swelling.  Gastrointestinal: Negative.   Endocrine: Negative for cold intolerance and heat intolerance.  Genitourinary: Negative.  Negative for difficulty urinating.  Allergic/Immunologic: Negative for immunocompromised state.  Neurological: Negative.   Hematological: Does not bruise/bleed easily.  Psychiatric/Behavioral: Negative for dysphoric mood.  The patient is not nervous/anxious and is not hyperactive.     Objective:  BP 102/70 (BP Location: Left Arm, Patient Position: Sitting, Cuff Size: Normal)   Pulse 77   Ht 5\' 10"  (1.778 m)   Wt 161 lb 4 oz (73.1 kg)   SpO2 97%   BMI 23.14 kg/m   BP Readings from Last 3 Encounters:  07/12/17 102/70  01/02/17 114/68  11/04/15 108/70    Wt Readings from Last 3 Encounters:  07/12/17 161 lb 4 oz (73.1 kg)  01/02/17 160 lb (72.6 kg)  11/04/15 163 lb (73.9 kg)    Physical Exam  Constitutional: He is oriented to person, place, and time. He appears well-developed and well-nourished. No distress.  HENT:  Head: Normocephalic and atraumatic.  Right Ear: External ear normal.  Left Ear: External ear normal.  Mouth/Throat: Oropharynx is clear and moist. No oropharyngeal exudate.  Eyes: Conjunctivae are normal. Pupils are equal, round, and reactive to light. Right eye exhibits no discharge. Left eye exhibits no discharge. No scleral icterus.  Neck: Neck supple. No JVD present. No tracheal deviation present. No thyromegaly present.  Cardiovascular: Normal rate, regular rhythm and normal heart sounds.  Pulmonary/Chest: Effort normal and breath sounds normal. No stridor.  Abdominal: Bowel sounds are normal.  Lymphadenopathy:    He has no cervical adenopathy.  Neurological: He is alert and oriented to person, place, and time.  Skin: Skin is warm and dry. He is not diaphoretic.  Psychiatric: He has a normal mood and affect. His behavior is  normal.    Lab Results  Component Value Date   WBC 6.2 01/10/2013   HGB 14.9 01/10/2013   HCT 42.9 01/10/2013   PLT 164 01/10/2013   GLUCOSE 85 01/10/2013   CHOL 170 01/10/2013   TRIG 45 01/10/2013   HDL 59 01/10/2013   LDLCALC 102 (H) 01/10/2013   ALT 23 01/10/2013   AST 20 01/10/2013   NA 138 01/10/2013   K 4.0 01/10/2013   CL 103 01/10/2013   CREATININE 1.02 01/10/2013   BUN 15 01/10/2013   CO2 23 01/10/2013   TSH 0.43 03/17/2015    No  results found.  Assessment & Plan:   Edward Lin was seen today for establish care.  Diagnoses and all orders for this visit:  Attention deficit disorder (ADD) without hyperactivity  Other orders -     Discontinue: amphetamine-dextroamphetamine (ADDERALL) 5 MG tablet; Take 1 tablet (5 mg total) by mouth 2 (two) times daily. -     Discontinue: amphetamine-dextroamphetamine (ADDERALL) 5 MG tablet; Take 1 tablet (5 mg total) by mouth 2 (two) times daily for 28 days. -     amphetamine-dextroamphetamine (ADDERALL) 5 MG tablet; Take 1 tablet (5 mg total) by mouth 2 (two) times daily.   I have discontinued Inda Coke. Michalec's clobetasol cream, amphetamine-dextroamphetamine, and amphetamine-dextroamphetamine. I have also changed his amphetamine-dextroamphetamine.  Meds ordered this encounter  Medications  . DISCONTD: amphetamine-dextroamphetamine (ADDERALL) 5 MG tablet    Sig: Take 1 tablet (5 mg total) by mouth 2 (two) times daily.    Dispense:  60 tablet    Refill:  0    Fill on or after 03/03/17  . DISCONTD: amphetamine-dextroamphetamine (ADDERALL) 5 MG tablet    Sig: Take 1 tablet (5 mg total) by mouth 2 (two) times daily for 28 days.    Dispense:  60 tablet    Refill:  0  . amphetamine-dextroamphetamine (ADDERALL) 5 MG tablet    Sig: Take 1 tablet (5 mg total) by mouth 2 (two) times daily.    Dispense:  60 tablet    Refill:  0     Follow-up: No Follow-up on file.  Edward Maw, MD

## 2017-10-24 ENCOUNTER — Encounter: Payer: Self-pay | Admitting: Family Medicine

## 2017-10-24 ENCOUNTER — Ambulatory Visit: Payer: BC Managed Care – PPO | Admitting: Family Medicine

## 2017-10-24 VITALS — BP 110/70 | HR 79 | Ht 70.0 in | Wt 159.1 lb

## 2017-10-24 DIAGNOSIS — F988 Other specified behavioral and emotional disorders with onset usually occurring in childhood and adolescence: Secondary | ICD-10-CM | POA: Diagnosis not present

## 2017-10-24 DIAGNOSIS — F4321 Adjustment disorder with depressed mood: Secondary | ICD-10-CM | POA: Diagnosis not present

## 2017-10-24 MED ORDER — AMPHETAMINE-DEXTROAMPHETAMINE 5 MG PO TABS: 5.0000 mg | ORAL_TABLET | Freq: Two times a day (BID) | ORAL | 0 refills | Status: DC

## 2017-10-24 NOTE — Progress Notes (Signed)
Subjective:  Patient ID: Edward Lin, male    DOB: [birth year]  Age: 32 y.o. MRN: [id number]  CC: No chief complaint on file.   HPI Edward Lin presents for follow-up of his attention deficit disorder.  He is taking Adderall twice a day on the days that he chooses to take it.  He usually lays off on the weekends.  His symptoms are well controlled on this regimen.  Of significance he lost his father 2 weeks ago due to pancreatic cancer.  His father was diagnosed shortly after this past Christmas.  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  Upon his father's death the congregation had the expectation that patient would fill his father shoes.  His father's illness and church responsibilities have led to stress in the marriage.  His wife is understanding but he feels as though he is snapping at her more than he should.  Patient is sleeping at night well but sometimes does wake up early in the morning.  Outpatient Medications Prior to Visit  Medication Sig Dispense Refill  . amphetamine-dextroamphetamine (ADDERALL) 5 MG tablet Take 1 tablet (5 mg total) by mouth 2 (two) times daily. 60 tablet 0   No facility-administered medications prior to visit.     ROS Review of Systems  Constitutional: Negative.   Respiratory: Negative.   Cardiovascular: Negative for palpitations.  Gastrointestinal: Negative.   Skin: Negative.   Allergic/Immunologic: Negative for immunocompromised state.  Neurological: Negative for dizziness, weakness and headaches.  Psychiatric/Behavioral: Positive for agitation and decreased concentration. Negative for behavioral problems, self-injury, sleep disturbance and suicidal ideas. The patient is not hyperactive.     Objective:  BP 110/70   Pulse 79   Ht 5\' 10"  (1.778 m)   Wt 159 lb 2 oz (72.2 kg)   SpO2 98%    BMI 22.83 kg/m   BP Readings from Last 3 Encounters:  10/24/17 110/70  07/12/17 102/70  01/02/17 114/68    Wt Readings from Last 3 Encounters:  10/24/17 159 lb 2 oz (72.2 kg)  07/12/17 161 lb 4 oz (73.1 kg)  01/02/17 160 lb (72.6 kg)    Physical Exam  Constitutional: He is oriented to person, place, and time. He appears well-developed and well-nourished. No distress.  HENT:  Head: Normocephalic and atraumatic.  Right Ear: External ear normal.  Left Ear: External ear normal.  Eyes: Conjunctivae are normal. Right eye exhibits no discharge. Left eye exhibits no discharge. No scleral icterus.  Neck: No JVD present. No tracheal deviation present.  Pulmonary/Chest: Effort normal.  Neurological: He is alert and oriented to person, place, and time.  Skin: Skin is warm and dry. He is not diaphoretic.  Psychiatric: He has a normal mood and affect. His behavior is normal.    Lab Results  Component Value Date   WBC 6.2 01/10/2013   HGB 14.9 01/10/2013   HCT 42.9 01/10/2013   PLT 164 01/10/2013   GLUCOSE 85 01/10/2013   CHOL 170 01/10/2013   TRIG 45 01/10/2013   HDL 59 01/10/2013   LDLCALC 102 (H) 01/10/2013   ALT 23 01/10/2013   AST 20 01/10/2013   NA 138 01/10/2013   K 4.0 01/10/2013   CL 103 01/10/2013   CREATININE 1.02 01/10/2013   BUN 15 01/10/2013   CO2 23 01/10/2013   TSH 0.43  03/17/2015    No results found.  Assessment & Plan:   Diagnoses and all orders for this visit:  Attention deficit disorder (ADD) without hyperactivity  Grieving  Other orders -     Discontinue: amphetamine-dextroamphetamine (ADDERALL) 5 MG tablet; Take 1 tablet (5 mg total) by mouth 2 (two) times daily. -     Discontinue: amphetamine-dextroamphetamine (ADDERALL) 5 MG tablet; Take 1 tablet (5 mg total) by mouth 2 (two) times daily. -     amphetamine-dextroamphetamine (ADDERALL) 5 MG tablet; Take 1 tablet (5 mg total) by mouth 2 (two) times daily.   I am having Edward Lin  maintain his amphetamine-dextroamphetamine.  Meds ordered this encounter  Medications  . DISCONTD: amphetamine-dextroamphetamine (ADDERALL) 5 MG tablet    Sig: Take 1 tablet (5 mg total) by mouth 2 (two) times daily.    Dispense:  60 tablet    Refill:  0  . DISCONTD: amphetamine-dextroamphetamine (ADDERALL) 5 MG tablet    Sig: Take 1 tablet (5 mg total) by mouth 2 (two) times daily.    Dispense:  60 tablet    Refill:  0    Fill in 30 days  . amphetamine-dextroamphetamine (ADDERALL) 5 MG tablet    Sig: Take 1 tablet (5 mg total) by mouth 2 (two) times daily.    Dispense:  60 tablet    Refill:  0    Fill in 60 days   Patient was given information on grieving and anticipatory guidance.  Suggested that he may want to back off taking the Adderall as much as possible over the next few months.  Advised him not to expect too much from himself and to learn to say no.  His small church is agreed not to meet through the month of May and reconvene in June to make plans going forward.  Spent over 20 minutes with this patient with those with over 50% of the time spent in discussion of the grieving process.  Follow-up: Return in about 3 months (around 01/24/2018).  Libby Maw, MD

## 2018-04-30 ENCOUNTER — Telehealth: Payer: Self-pay | Admitting: Family Medicine

## 2018-04-30 NOTE — Telephone Encounter (Signed)
Pt called to transfer care from dr Ethelene Hal to Dr Darnell Level He stated his dad  Edward Lin was a patient of dr g    Is it ok to schedule.   Best number [phone]

## 2018-04-30 NOTE — Telephone Encounter (Signed)
Okay to transfer  

## 2018-04-30 NOTE — Telephone Encounter (Signed)
Ok by me - I see family. May place in open 30 min slot. Thank you

## 2018-05-02 NOTE — Telephone Encounter (Signed)
Let message asking pt to call office

## 2018-05-02 NOTE — Telephone Encounter (Signed)
Appointment 12/10

## 2018-06-04 ENCOUNTER — Ambulatory Visit: Payer: BC Managed Care – PPO | Admitting: Family Medicine

## 2018-06-04 ENCOUNTER — Encounter: Payer: Self-pay | Admitting: Family Medicine

## 2018-06-04 VITALS — BP 118/66 | HR 57 | Temp 97.8°F | Ht 69.5 in | Wt 163.0 lb

## 2018-06-04 DIAGNOSIS — F988 Other specified behavioral and emotional disorders with onset usually occurring in childhood and adolescence: Secondary | ICD-10-CM | POA: Diagnosis not present

## 2018-06-04 DIAGNOSIS — Z23 Encounter for immunization: Secondary | ICD-10-CM | POA: Diagnosis not present

## 2018-06-04 MED ORDER — AMPHETAMINE-DEXTROAMPHET ER 10 MG PO CP24: 10.0000 mg | ORAL_CAPSULE | Freq: Every day | ORAL | 0 refills | Status: DC

## 2018-06-04 NOTE — Progress Notes (Signed)
BP 118/66 (BP Location: Left Arm, Patient Position: Sitting, Cuff Size: Normal)   Pulse (!) 57   Temp 97.8 F (36.6 C) (Oral)   Ht 5' 9.5" (1.765 m)   Wt 163 lb (73.9 kg)   SpO2 99%   BMI 23.73 kg/m    CC: transfer care visit Subjective:    Patient ID: Edward Lin, male    DOB: 1985/11/12, 32 y.o.   MRN: [id number]  HPI: TRON FLYTHE is a 32 y.o. male presenting on 06/04/2018 for Transfer Patient (Pt is transferring from Dr. Ethelene Hal. )   Saw Dr Linna Darner growing up then Dr Ethelene Hal at [facility]. I cared for his father and care for his sister and mother.   ADD - dx in grade school by Dr Linna Darner. Always struggled with reading comprehension. Takes adderall 5mg  twice daily for this. On this for several years. He was on ritalin during HS but noted some weight loss. Takes at least 1 daily, lasts 4 hours (8am and 1pm). Tolerates med well - no weight loss, chest pain, headache or insomnia.   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 Interim medical history since our last visit reviewed. Allergies and medications reviewed and updated. Outpatient Medications Prior to Visit  Medication Sig Dispense Refill  . amphetamine-dextroamphetamine (ADDERALL) 5 MG tablet Take 1 tablet (5 mg total) by mouth 2 (two) times daily. 60 tablet 0   No facility-administered medications prior to visit.     Social History   Tobacco Use  . Smoking status: Never Smoker  . Smokeless tobacco: Never Used  Substance Use Topics  . Alcohol use: Yes    Comment: occasional  . Drug use: No    Per HPI unless specifically indicated in ROS section below Review of Systems     Objective:    BP 118/66 (BP  Location: Left Arm, Patient Position: Sitting, Cuff Size: Normal)   Pulse (!) 57   Temp 97.8 F (36.6 C) (Oral)   Ht 5' 9.5" (1.765 m)   Wt 163 lb (73.9 kg)   SpO2 99%   BMI 23.73 kg/m   Wt Readings from Last 3 Encounters:  06/04/18 163 lb (73.9 kg)  10/24/17 159 lb 2 oz (72.2 kg)  07/12/17 161 lb 4 oz (73.1 kg)    Physical Exam  Constitutional: He appears well-developed and well-nourished. No distress.  HENT:  Mouth/Throat: Oropharynx is clear and moist. No oropharyngeal exudate.  Eyes: Pupils are equal, round, and reactive to light. EOM are normal.  Neck: Normal range of motion. Neck supple. No thyromegaly present.  Cardiovascular: Normal rate, regular rhythm and normal heart sounds.  No murmur heard. Pulmonary/Chest: Effort normal and breath sounds normal. No respiratory distress. He has no wheezes. He has no rales.  Musculoskeletal: He exhibits no edema.  Lymphadenopathy:    He has no cervical adenopathy.  Skin: Skin is warm and dry. No rash noted.  Psychiatric: He has a normal mood and affect.  Nursing note and vitals reviewed.  Results for orders placed or performed in visit on 03/17/15  TSH  Result Value Ref Range   TSH 0.43 0.35 - 4.50 uIU/mL      Assessment & Plan:  Over 25 minutes were spent face-to-face with the patient during this encounter and >50% of that time was spent on counseling and coordination of care  Problem List Items Addressed This Visit    Attention deficit disorder (ADD) in adult - Primary    Adderall restarted 12/2016.  Discussed XR vs IR formulation - he is interested in trying out extended release form - will price this out at local pharmacy. If unaffordable, he will let me know to restart IR 5mg  BID form.  Discussed this is a controlled substance with increased regulations and pros/cons of medication, reviewed protocol at our office - forgot to provide with controlled substance agreement contract/UDS. Will provide at next visit. Pt agrees with  plan.        Other Visit Diagnoses    Need for influenza vaccination       Relevant Orders   Flu Vaccine QUAD 36+ mos IM (Completed)       Meds ordered this encounter  Medications  . amphetamine-dextroamphetamine (ADDERALL XR) 10 MG 24 hr capsule    Sig: Take 1 capsule (10 mg total) by mouth daily.    Dispense:  30 capsule    Refill:  0   Orders Placed This Encounter  Procedures  . Flu Vaccine QUAD 36+ mos IM    Follow up plan: Return in about 4 months (around 10/04/2018) for annual exam, prior fasting for blood work.  Ria Bush, MD

## 2018-06-04 NOTE — Assessment & Plan Note (Signed)
Adderall restarted 12/2016.  Discussed XR vs IR formulation - he is interested in trying out extended release form - will price this out at local pharmacy. If unaffordable, he will let me know to restart IR 5mg  BID form.  Discussed this is a controlled substance with increased regulations and pros/cons of medication, reviewed protocol at our office - forgot to provide with controlled substance agreement contract/UDS. Will provide at next visit. Pt agrees with plan.

## 2018-06-04 NOTE — Patient Instructions (Addendum)
Flu shot today Return at your convenience (3-4 months) for physical with fasting labs. Price out adderall XR. If too expensive, let me know for refill of regular adderall 5mg .

## 2018-08-05 ENCOUNTER — Other Ambulatory Visit: Payer: Self-pay | Admitting: Family Medicine

## 2018-08-05 NOTE — Telephone Encounter (Signed)
Will you address in Dr. Synthia Innocent absence?  Name of Medication: Adderall XR Name of Pharmacy: [facility] Date and Quantity: 06/04/18, #30 Last Office Visit and Type: 06/04/18, f/u Next Office Visit and Type: 08/25/18, CPE Last Controlled Substance Agreement Date: none Last UDS: none

## 2018-08-06 ENCOUNTER — Telehealth: Payer: Self-pay

## 2018-08-06 NOTE — Telephone Encounter (Signed)
Submitted PA for Adderall XR 10 mg cap, key:  [facility], Rx #:  [id number].  Decision pending.

## 2018-08-06 NOTE — Telephone Encounter (Signed)
Sent. Thanks.   

## 2018-08-13 NOTE — Telephone Encounter (Signed)
Received PA approval effective 08/08/2018- 08/08/2021.  Notified [facility].

## 2018-08-26 ENCOUNTER — Other Ambulatory Visit: Payer: Self-pay | Admitting: Family Medicine

## 2018-08-26 DIAGNOSIS — Z13220 Encounter for screening for lipoid disorders: Secondary | ICD-10-CM

## 2018-08-26 DIAGNOSIS — R002 Palpitations: Secondary | ICD-10-CM

## 2018-08-26 DIAGNOSIS — Z131 Encounter for screening for diabetes mellitus: Secondary | ICD-10-CM

## 2018-08-28 ENCOUNTER — Other Ambulatory Visit: Payer: BC Managed Care – PPO

## 2018-08-29 ENCOUNTER — Other Ambulatory Visit: Payer: BC Managed Care – PPO

## 2018-09-04 ENCOUNTER — Encounter: Payer: BC Managed Care – PPO | Admitting: Family Medicine

## 2018-10-29 ENCOUNTER — Other Ambulatory Visit: Payer: BC Managed Care – PPO

## 2018-11-04 ENCOUNTER — Encounter: Payer: BC Managed Care – PPO | Admitting: Family Medicine

## 2018-11-05 ENCOUNTER — Other Ambulatory Visit: Payer: Self-pay | Admitting: Family Medicine

## 2018-11-05 NOTE — Telephone Encounter (Signed)
Best number [phone] Pt called checking on rx

## 2018-11-05 NOTE — Telephone Encounter (Signed)
Name of Medication: Adderall XR 10mg  Name of Pharmacy: [facility] or Written Date and Quantity: 08/06/2018 #30 Last Office Visit and Type: 06/04/18, f/u Next Office Visit and Type: 02/17/2019, CPE Last Controlled Substance Agreement Date: none Last UDS: none

## 2018-11-07 NOTE — Telephone Encounter (Signed)
Eprescribed.

## 2019-01-07 ENCOUNTER — Other Ambulatory Visit: Payer: Self-pay | Admitting: Family Medicine

## 2019-01-07 NOTE — Telephone Encounter (Signed)
Name of Medication: Adderall XR Name of Pharmacy: [facility] or Written Date and Quantity: 11/07/18, #30 Last Office Visit and Type:  06/04/18, ADHD Next Office Visit and Type: 02/17/19, CPE Last Controlled Substance Agreement Date: none Last UDS: none

## 2019-01-08 NOTE — Telephone Encounter (Signed)
Eprescribed.

## 2019-02-07 ENCOUNTER — Telehealth: Payer: Self-pay

## 2019-02-07 NOTE — Telephone Encounter (Signed)
Left detailed VM w COVID screen and back door lab info   

## 2019-02-11 ENCOUNTER — Other Ambulatory Visit (INDEPENDENT_AMBULATORY_CARE_PROVIDER_SITE_OTHER): Payer: BC Managed Care – PPO

## 2019-02-11 ENCOUNTER — Other Ambulatory Visit: Payer: Self-pay

## 2019-02-11 DIAGNOSIS — Z131 Encounter for screening for diabetes mellitus: Secondary | ICD-10-CM

## 2019-02-11 DIAGNOSIS — Z13220 Encounter for screening for lipoid disorders: Secondary | ICD-10-CM

## 2019-02-11 DIAGNOSIS — R002 Palpitations: Secondary | ICD-10-CM

## 2019-02-11 LAB — COMPREHENSIVE METABOLIC PANEL
ALT: 31 U/L (ref 0–53)
AST: 20 U/L (ref 0–37)
Albumin: 4.5 g/dL (ref 3.5–5.2)
Alkaline Phosphatase: 56 U/L (ref 39–117)
BUN: 14 mg/dL (ref 6–23)
CO2: 29 mEq/L (ref 19–32)
Calcium: 9.2 mg/dL (ref 8.4–10.5)
Chloride: 104 mEq/L (ref 96–112)
Creatinine, Ser: 1 mg/dL (ref 0.40–1.50)
GFR: 86.17 mL/min (ref >60.00)
Glucose, Bld: 96 mg/dL (ref 70–99)
Potassium: 3.6 mEq/L (ref 3.5–5.1)
Sodium: 139 mEq/L (ref 135–145)
Total Bilirubin: 0.7 mg/dL (ref 0.2–1.2)
Total Protein: 6.9 g/dL (ref 6.0–8.3)

## 2019-02-11 LAB — LIPID PANEL
Cholesterol: 188 mg/dL (ref 0–200)
HDL: 63.2 mg/dL (ref >39.00)
LDL Cholesterol: 115 mg/dL — ABNORMAL HIGH (ref 0–99)
NonHDL: 125.05
Total CHOL/HDL Ratio: 3
Triglycerides: 49 mg/dL (ref 0.0–149.0)
VLDL: 9.8 mg/dL (ref 0.0–40.0)

## 2019-02-11 LAB — TSH: TSH: 0.46 u[IU]/mL (ref 0.35–4.50)

## 2019-02-17 ENCOUNTER — Ambulatory Visit (INDEPENDENT_AMBULATORY_CARE_PROVIDER_SITE_OTHER): Payer: BC Managed Care – PPO | Admitting: Family Medicine

## 2019-02-17 ENCOUNTER — Encounter: Payer: Self-pay | Admitting: Family Medicine

## 2019-02-17 VITALS — BP 109/77 | HR 88 | Temp 97.4°F

## 2019-02-17 DIAGNOSIS — F988 Other specified behavioral and emotional disorders with onset usually occurring in childhood and adolescence: Secondary | ICD-10-CM

## 2019-02-17 DIAGNOSIS — Z0000 Encounter for general adult medical examination without abnormal findings: Secondary | ICD-10-CM

## 2019-02-17 MED ORDER — AMPHETAMINE-DEXTROAMPHET ER 10 MG PO CP24: 10.0000 mg | ORAL_CAPSULE | Freq: Every day | ORAL | 0 refills | Status: DC

## 2019-02-17 NOTE — Assessment & Plan Note (Signed)
Preventative protocols reviewed and updated unless pt declined. Discussed healthy diet and lifestyle.  

## 2019-02-17 NOTE — Assessment & Plan Note (Signed)
Stable period on current regimen of adderall XR 10mg . Continue. RTC 6 mo ADD f/u visit.

## 2019-02-17 NOTE — Progress Notes (Signed)
Virtual visit completed through [employer]. Due to national recommendations of social distancing due to COVID-19, a virtual visit is felt to be most appropriate for this patient at this time. Reviewed limitations of a virtual visit.   Patient location: home Provider location: [place] at [facility], office If any vitals were documented, they were collected by patient at home unless specified below.    BP 109/77   Pulse 88   Temp (!) 97.4 F (36.3 C)   SpO2 96% Comment: RA   CC: CPE Subjective:    Patient ID: Edward Lin, male    DOB: 07/28/1985, 33 y.o.   MRN: [id number]  HPI: Edward Lin is a 33 y.o. male presenting on 02/17/2019 for Annual Exam   ADHD on adderall - requests refill. Tolerating well. Finds effect lasts 8 hrs.  New 2 mo old at home.   Preventative: Flu shot yearly - at work Tdap 2016 Seat belt use discussed Sunscreen use discussed. No changing moles on skin  Non smoker Alcohol - rare Dentist q6 mo Eye exam has not seen recently   Lives with wife Edward Lin) and son and daughter Occ: Dealer Fun/Hobby: Sleep, Snowboard  Exercise: no regular exercise - time constraints Diet: good water, fruits/vegetables daily, enjoys cheer wine       Relevant past medical, surgical, family and social history reviewed and updated as indicated. Interim medical history since our last visit reviewed. Allergies and medications reviewed and updated. Outpatient Medications Prior to Visit  Medication Sig Dispense Refill  . cetirizine (ZYRTEC) 10 MG tablet Take 10 mg by mouth daily.    Marland Kitchen amphetamine-dextroamphetamine (ADDERALL XR) 10 MG 24 hr capsule TAKE 1 CAPSULE BY MOUTH DAILY 30 capsule 0  . amphetamine-dextroamphetamine (ADDERALL) 5 MG tablet Take 1 tablet (5 mg total) by mouth 2 (two) times daily. 60 tablet 0   No facility-administered medications prior to visit.      Per HPI unless specifically indicated in ROS section below Review of Systems  Constitutional:  Negative for activity change, appetite change, chills, fatigue, fever and unexpected weight change.  HENT: Positive for congestion (allergic). Negative for hearing loss.   Eyes: Negative for visual disturbance.  Respiratory: Negative for cough, chest tightness, shortness of breath and wheezing.   Cardiovascular: Positive for palpitations (rare). Negative for chest pain and leg swelling.  Gastrointestinal: Negative for abdominal distention, abdominal pain, blood in stool, constipation, diarrhea, nausea and vomiting.  Genitourinary: Negative for difficulty urinating and hematuria.  Musculoskeletal: Negative for arthralgias, myalgias and neck pain.  Skin: Negative for rash.  Neurological: Positive for dizziness (lightheadedness). Negative for seizures, syncope and headaches.  Hematological: Negative for adenopathy. Does not bruise/bleed easily.  Psychiatric/Behavioral: Negative for dysphoric mood. The patient is not nervous/anxious.    Objective:    BP 109/77   Pulse 88   Temp (!) 97.4 F (36.3 C)   SpO2 96% Comment: RA  Wt Readings from Last 3 Encounters:  06/04/18 163 lb (73.9 kg)  10/24/17 159 lb 2 oz (72.2 kg)  07/12/17 161 lb 4 oz (73.1 kg)     Physical exam: Gen: alert, NAD, not ill appearing Pulm: speaks in complete sentences without increased work of breathing Psych: normal mood, normal thought content      Results for orders placed or performed in visit on 02/11/19  TSH  Result Value Ref Range   TSH 0.46 0.35 - 4.50 uIU/mL  Comprehensive metabolic panel  Result Value Ref Range   Sodium 139 135 -  145 mEq/L   Potassium 3.6 3.5 - 5.1 mEq/L   Chloride 104 96 - 112 mEq/L   CO2 29 19 - 32 mEq/L   Glucose, Bld 96 70 - 99 mg/dL   BUN 14 6 - 23 mg/dL   Creatinine, Ser 1.00 0.40 - 1.50 mg/dL   Total Bilirubin 0.7 0.2 - 1.2 mg/dL   Alkaline Phosphatase 56 39 - 117 U/L   AST 20 0 - 37 U/L   ALT 31 0 - 53 U/L   Total Protein 6.9 6.0 - 8.3 g/dL   Albumin 4.5 3.5 - 5.2 g/dL    Calcium 9.2 8.4 - 10.5 mg/dL   GFR 86.17 >60.00 mL/min  Lipid panel  Result Value Ref Range   Cholesterol 188 0 - 200 mg/dL   Triglycerides 49.0 0.0 - 149.0 mg/dL   HDL 63.20 >39.00 mg/dL   VLDL 9.8 0.0 - 40.0 mg/dL   LDL Cholesterol 115 (H) 0 - 99 mg/dL   Total CHOL/HDL Ratio 3    NonHDL 125.05    Assessment & Plan:   Problem List Items Addressed This Visit    Health maintenance examination - Primary    Preventative protocols reviewed and updated unless pt declined. Discussed healthy diet and lifestyle.       Attention deficit disorder (ADD) in adult    Stable period on current regimen of adderall XR 10mg . Continue. RTC 6 mo ADD f/u visit.           Meds ordered this encounter  Medications  . amphetamine-dextroamphetamine (ADDERALL XR) 10 MG 24 hr capsule    Sig: Take 1 capsule (10 mg total) by mouth daily.    Dispense:  30 capsule    Refill:  0   No orders of the defined types were placed in this encounter.   I discussed the assessment and treatment plan with the patient. The patient was provided an opportunity to ask questions and all were answered. The patient agreed with the plan and demonstrated an understanding of the instructions. The patient was advised to call back or seek an in-person evaluation if the symptoms worsen or if the condition fails to improve as anticipated.  Follow up plan: Return in about 6 months (around 08/20/2019) for follow up visit.  Ria Bush, MD

## 2019-05-29 ENCOUNTER — Other Ambulatory Visit: Payer: Self-pay

## 2019-05-29 ENCOUNTER — Ambulatory Visit: Payer: BC Managed Care – PPO | Admitting: Family Medicine

## 2019-05-29 ENCOUNTER — Encounter: Payer: Self-pay | Admitting: Family Medicine

## 2019-05-29 VITALS — BP 116/64 | HR 74 | Temp 98.5°F | Ht 69.5 in | Wt 166.2 lb

## 2019-05-29 DIAGNOSIS — F988 Other specified behavioral and emotional disorders with onset usually occurring in childhood and adolescence: Secondary | ICD-10-CM

## 2019-05-29 DIAGNOSIS — F410 Panic disorder [episodic paroxysmal anxiety] without agoraphobia: Secondary | ICD-10-CM

## 2019-05-29 DIAGNOSIS — F418 Other specified anxiety disorders: Secondary | ICD-10-CM | POA: Insufficient documentation

## 2019-05-29 LAB — T4, FREE: Free T4: 0.93 ng/dL (ref 0.60–1.60)

## 2019-05-29 LAB — TSH: TSH: 0.37 u[IU]/mL (ref 0.35–4.50)

## 2019-05-29 MED ORDER — LORAZEPAM 0.5 MG PO TABS: 0.2500 mg | ORAL_TABLET | Freq: Two times a day (BID) | ORAL | 0 refills | Status: DC | PRN

## 2019-05-29 NOTE — Assessment & Plan Note (Signed)
Had more trouble restarting recently, likely contributing to worsening anxiety - will hold stimulant for now.

## 2019-05-29 NOTE — Assessment & Plan Note (Addendum)
Stressors reviewed, reviewed importance of stress relieving strategies. Discussed importance of limiting responsibilities at this time. He is not currently interested in daily medication. He is already seeing couples counselor. Discussed PRN medication including benzo vs hydroxyzine - will Rx ativan, reviewed risks of medication including habit forming nature, dependence, tolerance potential. Update with effect. In h/o high TSH, update TFTs r/o hyperthyroidism as cause of symptoms.

## 2019-05-29 NOTE — Progress Notes (Signed)
This visit was conducted in person.  BP 116/64 (BP Location: Left Arm, Patient Position: Sitting, Cuff Size: Normal)   Pulse 74   Temp 98.5 F (36.9 C) (Temporal)   Ht 5' 9.5" (1.765 m)   Wt 166 lb 4 oz (75.4 kg)   SpO2 98%   BMI 24.20 kg/m    CC: discuss anxiety Subjective:    Patient ID: Edward Lin, male    DOB: [birth year], 33 y.o.   MRN: [id number]  HPI: Edward Lin is a 33 y.o. male presenting on 05/29/2019 for Anxiety (Had stopped taking Adderall for 3 mo period, restarted for 1-2 wks and now has stopped again.  Thinks this may be reason for the anxiety. )   Significant increase in stress recently. Marital stressors - seeing couples counselor. 2nd child 11/2018 - increased stress with this. 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 Increasing anxiety attacks over last several months, acutely worse the last 2 weeks. Endorses 10 + anxiety attacks in the past month. Attack described as trouble catching breath associated with dizziness lightheadedness and racing heart with intermittent nausea. No chest pain, headache. + heat intolerance with sweating over last few months.   Decreased motivation, increased irritability. Stays fatigued. Sleeps well. Appetite ok. Some guilt feelings over marital stressors. + anhedonia. No SI/HI.   Noted significant drop in libido which started last week. This week sex drive has improved however.   Known ADD - had been off adderall for several months, restarted 2 wks ago - this may have worsened anxiety.      Relevant past medical, surgical, family and social history reviewed and updated as indicated. Interim medical history since our last visit reviewed. Allergies and medications reviewed and updated. Outpatient Medications Prior to Visit  Medication Sig Dispense Refill  . amphetamine-dextroamphetamine (ADDERALL XR) 10 MG 24 hr capsule Take 1 capsule (10 mg total) by  mouth daily. 30 capsule 0  . cetirizine (ZYRTEC) 10 MG tablet Take 10 mg by mouth daily. As needed seasonally     No facility-administered medications prior to visit.      Per HPI unless specifically indicated in ROS section below Review of Systems Objective:    BP 116/64 (BP Location: Left Arm, Patient Position: Sitting, Cuff Size: Normal)   Pulse 74   Temp 98.5 F (36.9 C) (Temporal)   Ht 5' 9.5" (1.765 m)   Wt 166 lb 4 oz (75.4 kg)   SpO2 98%   BMI 24.20 kg/m   Wt Readings from Last 3 Encounters:  05/29/19 166 lb 4 oz (75.4 kg)  06/04/18 163 lb (73.9 kg)  10/24/17 159 lb 2 oz (72.2 kg)    Physical Exam Vitals signs and nursing note reviewed.  Constitutional:      Appearance: Normal appearance. He is not ill-appearing.  Neurological:     Mental Status: He is alert.  Psychiatric:        Mood and Affect: Mood normal.        Behavior: Behavior normal.       Depression screen [facility] 2/9 05/29/2019 02/17/2019 10/24/2017 01/02/2017  Decreased Interest 3 0 2 0  Down, Depressed, Hopeless 2 0 2 0  PHQ - 2 Score 5 0 4 0  Altered sleeping 0 - 1 -  Tired, decreased energy 2 - 1 -  Change in appetite 0 - 0 -  Feeling bad or failure about yourself  1 -  0 -  Trouble concentrating 2 - 1 -  Moving slowly or fidgety/restless 0 - 3 -  Suicidal thoughts 0 - 0 -  PHQ-9 Score 10 - 10 -    GAD 7 : Generalized Anxiety Score 05/29/2019 10/24/2017  Nervous, Anxious, on Edge 3 3  Control/stop worrying 3 1  Worry too much - different things 3 2  Trouble relaxing 2 1  Restless 0 2  Easily annoyed or irritable 3 3  Afraid - awful might happen 1 1  Total GAD 7 Score 15 13     Assessment & Plan:  This visit occurred during the SARS-CoV-2 public health emergency.  Safety protocols were in place, including screening questions prior to the visit, additional usage of staff PPE, and extensive cleaning of exam room while observing appropriate contact time as indicated for disinfecting solutions.    Problem List Items Addressed This Visit    Attention deficit disorder (ADD) in adult    Had more trouble restarting recently, likely contributing to worsening anxiety - will hold stimulant for now.       Anxiety with limited-symptom attacks - Primary    Stressors reviewed, reviewed importance of stress relieving strategies. Discussed importance of limiting responsibilities at this time. He is not currently interested in daily medication. He is already seeing couples counselor. Discussed PRN medication including benzo vs hydroxyzine - will Rx ativan, reviewed risks of medication including habit forming nature, dependence, tolerance potential. Update with effect. In h/o high TSH, update TFTs r/o hyperthyroidism as cause of symptoms.       Relevant Medications   LORazepam (ATIVAN) 0.5 MG tablet   Other Relevant Orders   TSH (Completed)   T4, Free (Completed)       Meds ordered this encounter  Medications  . LORazepam (ATIVAN) 0.5 MG tablet    Sig: Take 0.5-1 tablets (0.25-0.5 mg total) by mouth 2 (two) times daily as needed for anxiety.    Dispense:  30 tablet    Refill:  0   Orders Placed This Encounter  Procedures  . TSH  . T4, Free    Patient instructions: Work on limiting things on your plate.  Work on healthy stress reliving strategies.  Thyroid levels checked today.  Take lorazepam (ativan) 1/2-1 tablet as needed for anxiety.  Update me with effect.   Follow up plan: Return if symptoms worsen or fail to improve.  Ria Bush, MD

## 2019-05-29 NOTE — Patient Instructions (Addendum)
Work on limiting things on your plate.  Work on healthy stress reliving strategies.  Thyroid levels checked today.  Take lorazepam (ativan) 1/2-1 tablet as needed for anxiety.  Update me with effect.   Stress Stress is a normal reaction to life events. Stress is what you feel when life demands more than you are used to, or more than you think you can handle. Some stress can be useful, such as studying for a test or meeting a deadline at work. Stress that occurs too often or for too long can cause problems. It can affect your emotional health and interfere with relationships and normal daily activities. Too much stress can weaken your body's defense system (immune system) and increase your risk for physical illness. If you already have a medical problem, stress can make it worse. What are the causes? All sorts of life events can cause stress. An event that causes stress for one person may not be stressful for another person. Major life events, whether positive or negative, commonly cause stress. Examples include:  Losing a job or starting a new job.  Losing a loved one.  Moving to a new town or home.  Getting married or divorced.  Having a baby.  Injury or illness. Less obvious life events can also cause stress, especially if they occur day after day or in combination with each other. Examples include:  Working long hours.  Driving in traffic.  Caring for children.  Being in debt.  Being in a difficult relationship. What are the signs or symptoms? Stress can cause emotional symptoms, including:  Anxiety. This is feeling worried, afraid, on edge, overwhelmed, or out of control.  Anger, including irritation or impatience.  Depression. This is feeling sad, down, helpless, or guilty.  Trouble focusing, remembering, or making decisions. Stress can cause physical symptoms, including:  Aches and pains. These may affect your head, neck, back, stomach, or other areas of your body.   Tight muscles or a clenched jaw.  Low energy.  Trouble sleeping. Stress can cause unhealthy behaviors, including:  Eating to feel better (overeating) or skipping meals.  Working too much or putting off tasks.  Smoking, drinking alcohol, or using drugs to feel better. How is this diagnosed? Stress is diagnosed through an assessment by your health care provider. He or she may diagnose this condition based on:  Your symptoms and any stressful life events.  Your medical history.  Tests to rule out other causes of your symptoms. Depending on your condition, your health care provider may refer you to a specialist for further evaluation. How is this treated?  Stress management techniques are the recommended treatment for stress. Medicine is not typically recommended for the treatment of stress. Techniques to reduce your reaction to stressful life events include:  Stress identification. Monitor yourself for symptoms of stress and identify what causes stress for you. These skills may help you to avoid or prepare for stressful events.  Time management. Set your priorities, keep a calendar of events, and learn to say "no." Taking these actions can help you avoid making too many commitments. Techniques for coping with stress include:  Rethinking the problem. Try to think realistically about stressful events rather than ignoring them or overreacting. Try to find the positives in a stressful situation rather than focusing on the negatives.  Exercise. Physical exercise can release both physical and emotional tension. The key is to find a form of exercise that you enjoy and do it regularly.  Relaxation techniques. These   relax the body and mind. The key is to find one or more that you enjoy and use the technique(s) regularly. Examples include: ? Meditation, deep breathing, or progressive relaxation techniques. ? Yoga or tai chi. ? Biofeedback, mindfulness techniques, or journaling. ?  Listening to music, being out in nature, or participating in other hobbies.  Practicing a healthy lifestyle. Eat a balanced diet, drink plenty of water, limit or avoid caffeine, and get plenty of sleep.  Having a strong support network. Spend time with family, friends, or other people you enjoy being around. Express your feelings and talk things over with someone you trust. Counseling or talk therapy with a mental health professional may be helpful if you are having trouble managing stress on your own. Follow these instructions at home: Lifestyle   Avoid drugs.  Do not use any products that contain nicotine or tobacco, such as cigarettes and e-cigarettes. If you need help quitting, ask your health care provider.  Limit alcohol intake to no more than 1 drink a day for nonpregnant women and 2 drinks a day for men. One drink equals 12 oz of beer, 5 oz of wine, or 1 oz of hard liquor.  Do not use alcohol or drugs to relax.  Eat a balanced diet that includes fresh fruits and vegetables, whole grains, lean meats, fish, eggs, and beans, and low-fat dairy. Avoid processed foods and foods high in added fat, sugar, and salt.  Exercise at least 30 minutes on 5 or more days each week.  Get 7-8 hours of sleep each night. General instructions   Practice stress management techniques as discussed with your health care provider.  Drink enough fluid to keep your urine clear or pale yellow.  Take over-the-counter and prescription medicines only as told by your health care provider.  Keep all follow-up visits as told by your health care provider. This is important. Contact a health care provider if:  Your symptoms get worse.  You have new symptoms.  You feel overwhelmed by your problems and can no longer manage them on your own. Get help right away if:  You have thoughts of hurting yourself or others. If you ever feel like you may hurt yourself or others, or have thoughts about taking your  own life, get help right away. You can go to your nearest emergency department or call:  Your local emergency services (911 in the [place]  A suicide crisis helpline, such as the [facility] at 360 830 4654. This is open 24 hours a day. Summary  Stress is a normal reaction to life events. It can cause problems if it happens too often or for too long.  Practicing stress management techniques is the best way to treat stress.  Counseling or talk therapy with a mental health professional may be helpful if you are having trouble managing stress on your own. This information is not intended to replace advice given to you by your health care provider. Make sure you discuss any questions you have with your health care provider. Document Released: 12/06/2000 Document Revised: 05/25/2017 Document Reviewed: 08/02/2016 Elsevier Patient Education  2020 [employer].

## 2019-05-30 NOTE — Addendum Note (Signed)
Addended by: Ellamae Sia on: 05/30/2019 09:46 AM   Modules accepted: Orders

## 2019-05-31 LAB — T3: T3, Total: 104 ng/dL (ref 76–181)

## 2019-07-14 ENCOUNTER — Other Ambulatory Visit: Payer: Self-pay

## 2019-07-14 ENCOUNTER — Encounter: Payer: Self-pay | Admitting: Family Medicine

## 2019-07-14 ENCOUNTER — Ambulatory Visit: Payer: BC Managed Care – PPO | Admitting: Family Medicine

## 2019-07-14 VITALS — BP 116/64 | HR 80 | Temp 97.9°F | Ht 69.5 in | Wt 167.6 lb

## 2019-07-14 DIAGNOSIS — Z3009 Encounter for other general counseling and advice on contraception: Secondary | ICD-10-CM

## 2019-07-14 DIAGNOSIS — F418 Other specified anxiety disorders: Secondary | ICD-10-CM | POA: Diagnosis not present

## 2019-07-14 MED ORDER — BUPROPION HCL ER (SR) 100 MG PO TB12: 100.0000 mg | ORAL_TABLET | Freq: Every day | ORAL | 3 refills | Status: DC

## 2019-07-14 NOTE — Assessment & Plan Note (Signed)
Has not tried ativan. Interested in daily medication to help manage mood swings. Desires medication that does not affect libido. In ADD history, wellbutrin may be a good option. Discussed side effects to watch for, discussed activation medication, will start wellbutrin SR 100mg  once daily and have him update me in 1 month with effect, sooner if needed.

## 2019-07-14 NOTE — Assessment & Plan Note (Addendum)
Discussed vasectomy, rec approach this as irreversible procedure. He and wife do want permanent sterilization. Will refer to [facility] urology.

## 2019-07-14 NOTE — Progress Notes (Signed)
This visit was conducted in person.  BP 116/64 (BP Location: Left Arm, Patient Position: Sitting, Cuff Size: Normal)   Pulse 80   Temp 97.9 F (36.6 C) (Temporal)   Ht 5' 9.5" (1.765 m)   Wt 167 lb 9 oz (76 kg)   SpO2 99%   BMI 24.39 kg/m    CC: discuss vasectomy Subjective:    Patient ID: Edward Lin, male    DOB: 1986-05-20, 34 y.o.   MRN: [id number]  HPI: Edward Lin is a 34 y.o. male presenting on 07/14/2019 for Referral (Wants to discuss referral for vasectomey. )   Interested in vasectomy. Would like referral locally.  Has 2 children - 2018, 2020.  He and wife don't want any more children.   Did not try ativan. He continues Patent examiner. He may be interested in daily medication. Noticing more mood swings.      Relevant past medical, surgical, family and social history reviewed and updated as indicated. Interim medical history since our last visit reviewed. Allergies and medications reviewed and updated. Outpatient Medications Prior to Visit  Medication Sig Dispense Refill  . amphetamine-dextroamphetamine (ADDERALL XR) 10 MG 24 hr capsule Take 1 capsule (10 mg total) by mouth daily. 30 capsule 0  . cetirizine (ZYRTEC) 10 MG tablet Take 10 mg by mouth daily. As needed seasonally    . LORazepam (ATIVAN) 0.5 MG tablet Take 0.5-1 tablets (0.25-0.5 mg total) by mouth 2 (two) times daily as needed for anxiety. 30 tablet 0   No facility-administered medications prior to visit.     Per HPI unless specifically indicated in ROS section below Review of Systems Objective:    BP 116/64 (BP Location: Left Arm, Patient Position: Sitting, Cuff Size: Normal)   Pulse 80   Temp 97.9 F (36.6 C) (Temporal)   Ht 5' 9.5" (1.765 m)   Wt 167 lb 9 oz (76 kg)   SpO2 99%   BMI 24.39 kg/m   Wt Readings from Last 3 Encounters:  07/14/19 167 lb 9 oz (76 kg)  05/29/19 166 lb 4 oz (75.4 kg)  06/04/18 163 lb (73.9 kg)    Physical Exam Vitals and nursing note reviewed.   Constitutional:      Appearance: Normal appearance. He is not ill-appearing.  Neurological:     Mental Status: He is alert.  Psychiatric:        Mood and Affect: Mood normal.        Behavior: Behavior normal.       Results for orders placed or performed in visit on 05/29/19  TSH  Result Value Ref Range   TSH 0.37 0.35 - 4.50 uIU/mL  T4, Free  Result Value Ref Range   Free T4 0.93 0.60 - 1.60 ng/dL  T3  Result Value Ref Range   T3, Total 104 76 - 181 ng/dL   Depression screen [facility] 2/9 05/29/2019 02/17/2019 10/24/2017 01/02/2017  Decreased Interest 3 0 2 0  Down, Depressed, Hopeless 2 0 2 0  PHQ - 2 Score 5 0 4 0  Altered sleeping 0 - 1 -  Tired, decreased energy 2 - 1 -  Change in appetite 0 - 0 -  Feeling bad or failure about yourself  1 - 0 -  Trouble concentrating 2 - 1 -  Moving slowly or fidgety/restless 0 - 3 -  Suicidal thoughts 0 - 0 -  PHQ-9 Score 10 - 10 -    GAD 7 : Generalized Anxiety Score 05/29/2019  10/24/2017  Nervous, Anxious, on Edge 3 3  Control/stop worrying 3 1  Worry too much - different things 3 2  Trouble relaxing 2 1  Restless 0 2  Easily annoyed or irritable 3 3  Afraid - awful might happen 1 1  Total GAD 7 Score 15 13   Assessment & Plan:  This visit occurred during the SARS-CoV-2 public health emergency.  Safety protocols were in place, including screening questions prior to the visit, additional usage of staff PPE, and extensive cleaning of exam room while observing appropriate contact time as indicated for disinfecting solutions.   Problem List Items Addressed This Visit    Vasectomy evaluation - Primary    Discussed vasectomy, rec approach this as irreversible procedure. He and wife do want permanent sterilization. Will refer to [facility] urology.       Relevant Orders   Ambulatory referral to Urology   Anxiety with limited-symptom attacks    Has not tried ativan. Interested in daily medication to help manage mood swings. Desires medication  that does not affect libido. In ADD history, wellbutrin may be a good option. Discussed side effects to watch for, discussed activation medication, will start wellbutrin SR 100mg  once daily and have him update me in 1 month with effect, sooner if needed.       Relevant Medications   buPROPion (WELLBUTRIN SR) 100 MG 12 hr tablet       Meds ordered this encounter  Medications  . buPROPion (WELLBUTRIN SR) 100 MG 12 hr tablet    Sig: Take 1 tablet (100 mg total) by mouth daily.    Dispense:  30 tablet    Refill:  3   Orders Placed This Encounter  Procedures  . Ambulatory referral to Urology    Referral Priority:   Routine    Referral Type:   Consultation    Referral Reason:   Specialty Services Required    Requested Specialty:   Urology    Number of Visits Requested:   1    Patient Instructions  We will refer you to urologist.  Trial wellbutrin 100mg  once daily.  Update me with effect in 1 month.    Follow up plan: Return if symptoms worsen or fail to improve.  Ria Bush, MD

## 2019-07-14 NOTE — Patient Instructions (Addendum)
We will refer you to urologist.  Trial wellbutrin 100mg  once daily.  Update me with effect in 1 month.

## 2019-08-04 ENCOUNTER — Ambulatory Visit: Payer: BC Managed Care – PPO | Admitting: Urology

## 2019-08-04 ENCOUNTER — Encounter: Payer: Self-pay | Admitting: Urology

## 2019-08-04 ENCOUNTER — Other Ambulatory Visit: Payer: Self-pay

## 2019-08-04 VITALS — BP 140/83 | HR 76 | Ht 71.0 in | Wt 160.0 lb

## 2019-08-04 DIAGNOSIS — Z3009 Encounter for other general counseling and advice on contraception: Secondary | ICD-10-CM | POA: Diagnosis not present

## 2019-08-04 NOTE — Progress Notes (Signed)
08/04/2019 12:56 PM   Lillard Anes 1985/07/28 [id number]  Referring provider: Ria Bush, MD [address] [place],  [place] [address]  Chief Complaint  Patient presents with  . VAS Consult    HPI: 34 y.o. male presents for vasectomy counseling.  He is married with 2 children and states he and his wife desire vasectomy as a means of permanent sterilization.  He denies previous urologic history and specifically denies history of chronic scrotal content pain, epididymitis or previous inguinal/genitourinary surgery.   PMH: Past Medical History:  Diagnosis Date  . Chicken pox   . Heart palpitations 01/2011    negative evaluation , Dr Stanford Breed  . History of depression   . 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 Syncope    X1 with venopuncture (vasovagal reaction)    Surgical History: Past Surgical History:  Procedure Laterality Date  . APPENDECTOMY  1990  . WISDOM TOOTH EXTRACTION      Home Medications:  Allergies as of 08/04/2019      Reactions   Penicillins    Rash      Medication List       Accurate as of August 04, 2019 12:56 PM. If you have any questions, ask your nurse or doctor.        amphetamine-dextroamphetamine 10 MG 24 hr capsule Commonly known as: ADDERALL XR Take 1 capsule (10 mg total) by mouth daily.   buPROPion 100 MG 12 hr tablet Commonly known as: Wellbutrin SR Take 1 tablet (100 mg total) by mouth daily.   cetirizine 10 MG tablet Commonly known as: ZYRTEC Take 10 mg by mouth daily. As needed seasonally   LORazepam 0.5 MG tablet Commonly known as: ATIVAN Take 0.5-1 tablets (0.25-0.5 mg total) by mouth 2 (two) times daily as needed for anxiety.       Allergies:  Allergies  Allergen Reactions  . Penicillins     Rash     Family History: Family History  Problem Relation Age of Onset  . Deep vein thrombosis Father        factor V deficiency  . Pancreatic cancer Father   . Diabetes Paternal Grandfather   . Cancer Maternal  Grandfather        ? prostate  . Cancer Maternal Uncle        esophageal  . Heart attack Paternal Uncle 26  . Stroke Neg Hx     Social History:  reports that he has never smoked. He has never used smokeless tobacco. He reports current alcohol use. He reports that he does not use drugs.  ROS: UROLOGY Frequent Urination?: No Hard to postpone urination?: No Burning/pain with urination?: No Get up at night to urinate?: No Leakage of urine?: No Urine stream starts and stops?: No Trouble starting stream?: No Do you have to strain to urinate?: No Blood in urine?: No Urinary tract infection?: No Sexually transmitted disease?: No Injury to kidneys or bladder?: No Painful intercourse?: No Weak stream?: No Erection problems?: No Penile pain?: No  Gastrointestinal Nausea?: No Vomiting?: No Indigestion/heartburn?: No Diarrhea?: No Constipation?: No  Constitutional Fever: No Night sweats?: No Weight loss?: No Fatigue?: No  Skin Skin rash/lesions?: No Itching?: No  Eyes Blurred vision?: No Double vision?: No  Ears/Nose/Throat Sore throat?: No Sinus problems?: No  Hematologic/Lymphatic Swollen glands?: No Easy bruising?: No  Cardiovascular Leg swelling?: No Chest pain?: No  Respiratory Cough?: No Shortness of breath?: No  Endocrine Excessive thirst?: No  Musculoskeletal Back pain?: No  Joint pain?: No  Neurological Headaches?: No Dizziness?: No  Psychologic Depression?: No Anxiety?: No  Physical Exam: BP 140/83   Pulse 76   Ht 5\' 11"  (1.803 m)   Wt 160 lb (72.6 kg)   BMI 22.32 kg/m   Constitutional:  Alert and oriented, No acute distress. HEENT: [place] AT, moist mucus membranes.  Trachea midline, no masses. Cardiovascular: No clubbing, cyanosis, or edema. Respiratory: Normal respiratory effort, no increased work of breathing. GU: Phallus without lesions, testes descended bilaterally without masses or tenderness.  Spermatic cord/epididymis  palpably normal bilaterally.  Vasa easily palpable bilaterally. Lymph: No inguinal lymphadenopathy. Skin: No rashes, bruises or suspicious lesions. Neurologic: Grossly intact, no focal deficits, moving all 4 extremities. Psychiatric: Normal mood and affect.   Assessment & Plan:    - Undesired fertility 34 y.o. old male with undesired fertility who wishes to schedule vasectomy.  We had a long discussion about vasectomy. We specifically discussed the procedure, recovery and the risks, benefits and alternatives of vasectomy. I explained that the procedure entails removal of a segment of each vas deferens, each of which conducts sperm, and that the purpose of this procedure is to cause sterility (inability to produce children or cause pregnancy). Vasectomy is intended to be permanent and irreversible form of contraception. Options for fertility after vasectomy include vasectomy reversal, or sperm retrieval with in vitro fertilization. These options are not always successful, and they may be expensive. We discussed reversible forms of birth control such as condoms, IUD or diaphragms, as well as the option of freezing sperm in a sperm bank prior to the vasectomy procedure. We discussed the importance of avoiding strenuous exercise for four days after vasectomy, and the importance of refraining from any form of ejaculation for seven days after vasectomy. I explained that vasectomy does not produce immediate sterility so another form of contraceptive must be used until sterility is assured by having semen checked for sperm. Thus, a post vasectomy semen analysis is necessary to confirm sterility. Rarely, vasectomy must be repeated. We discussed the approximately 1 in 2,000 risk of pregnancy after vasectomy for men who have post-vasectomy semen analysis showing absent sperm or rare non-motile sperm. Typical side effects include a small amount of oozing blood, some discomfort and mild swelling in the area of  incision.  Vasectomy does not affect sexual performance, function, please, sensation, interest, desire, satisfaction, penile erection, volume of semen or ejaculation. Other rare risks include allergy or adverse reaction to an anesthetic, testicular atrophy, hematoma, infection/abscess, prolonged tenderness of the vas deferens, pain, swelling, painful nodule or scar (called sperm granuloma) or epididymtis. We discussed chronic testicular pain syndrome. This has been reported to occur in as many as 1-2% of men and may be permanent. This can be treated with medication, small procedures or (rarely) surgery.  He does have a prescription waiting at his pharmacy for lorazepam and will take 0.5-1 mg 30 minutes prior to the procedure.  He was informed he would need a driver if utilizing this medication.    Abbie Sons, [place] [address], [place], [place] [address] [phone]

## 2019-08-04 NOTE — Patient Instructions (Signed)

## 2019-08-21 ENCOUNTER — Encounter: Payer: Self-pay | Admitting: Family Medicine

## 2019-08-21 NOTE — Telephone Encounter (Signed)
Ok to refill 

## 2019-08-22 MED ORDER — BUPROPION HCL ER (SR) 100 MG PO TB12: 100.0000 mg | ORAL_TABLET | Freq: Every day | ORAL | 3 refills | Status: DC

## 2019-08-22 NOTE — Telephone Encounter (Signed)
Pt is calling triage line asking what Dr Darnell Level was planning to do for him. Waiting to hear back.

## 2019-09-11 ENCOUNTER — Encounter: Payer: Self-pay | Admitting: Urology

## 2019-10-16 ENCOUNTER — Encounter: Payer: Self-pay | Admitting: Urology

## 2019-10-16 ENCOUNTER — Ambulatory Visit: Payer: BC Managed Care – PPO | Admitting: Urology

## 2019-10-16 ENCOUNTER — Other Ambulatory Visit: Payer: Self-pay

## 2019-10-16 VITALS — BP 137/77 | HR 92 | Ht 71.0 in | Wt 158.0 lb

## 2019-10-16 DIAGNOSIS — Z302 Encounter for sterilization: Secondary | ICD-10-CM

## 2019-10-16 DIAGNOSIS — Z3009 Encounter for other general counseling and advice on contraception: Secondary | ICD-10-CM

## 2019-10-16 MED ORDER — HYDROCODONE-ACETAMINOPHEN 5-325 MG PO TABS: 1.0000 | ORAL_TABLET | ORAL | 0 refills | Status: DC | PRN

## 2019-10-16 NOTE — Progress Notes (Signed)
Vasectomy Procedure Note  Indications: The patient is a 34 y.o. male who presents today for elective sterilization.  He has been consented for the procedure.  He is aware of the risks and benefits.  He had no additional questions.  He agrees to proceed.  He denies any other significant change since his last visit.  Pre-operative Diagnosis: Elective sterilization  Post-operative Diagnosis: Elective sterilization  Premedication: Valium 10 mg po  Surgeon: Nicki Reaper C. Thecla Forgione, M.D  Description: The patient was prepped and draped in the standard fashion.  The right vas deferens was identified and brought superiorly to the anterior scrotal skin.  The skin and vas was then anesthetized utilizing 7 ml 1% lidocaine.  A small stab incision was made and spread with the vas dissector.  The vas was grasped utilizing the vas clamp and elevated out of the incision.  The vas was dissected free from surrounding tissue and vessels and an ~1 cm segment was excised.  The vas lumens were cauterized utilizing electrocautery.  The distal segment was buried in the surrounding sheath with a 3-0 chromic suture.  No significant bleeding was observed.  The vas ends were then dropped back into the hemiscrotum.  The skin was closed with hemostatic pressure.  An identical procedure was performed on the contralateral side.  Clean dry gauze was applied to the incision sites.  The patient tolerated the procedure well.  Complications:None  Recommendations: 1.  No lifting greater than 10 pounds or strenuousactivity for 1 week. 2.  Scrotal support for 1 week. 3.  Shower only for 1 week; may shower in the morning 4.  May resume intercourse in one week if no significant discomfort.  Continue alternate contraception for 12 weeks.  5.  Call for significant pain, swelling, redness, drainage or fever greater than 100.5. 6.  Rx hydrocodone/APAP 5/325 1-2 every 6 hours as needed for pain. 7.  Follow-up semen analysis in 12  weeks.   John Giovanni, MD

## 2019-10-19 ENCOUNTER — Encounter: Payer: Self-pay | Admitting: Urology

## 2020-01-15 ENCOUNTER — Other Ambulatory Visit: Payer: Self-pay

## 2020-01-22 ENCOUNTER — Other Ambulatory Visit: Payer: Self-pay

## 2020-01-22 ENCOUNTER — Other Ambulatory Visit: Payer: BC Managed Care – PPO

## 2020-01-22 DIAGNOSIS — Z3009 Encounter for other general counseling and advice on contraception: Secondary | ICD-10-CM

## 2020-01-23 ENCOUNTER — Encounter: Payer: Self-pay | Admitting: Urology

## 2020-01-23 LAB — POST-VAS SPERM EVALUATION,QUAL: Volume: 2.2 mL

## 2020-04-30 ENCOUNTER — Other Ambulatory Visit: Payer: Self-pay

## 2020-04-30 ENCOUNTER — Encounter: Payer: Self-pay | Admitting: Family Medicine

## 2020-04-30 ENCOUNTER — Ambulatory Visit: Payer: BC Managed Care – PPO | Admitting: Family Medicine

## 2020-04-30 VITALS — BP 114/68 | HR 72 | Temp 97.9°F | Ht 71.0 in | Wt 166.2 lb

## 2020-04-30 DIAGNOSIS — N4889 Other specified disorders of penis: Secondary | ICD-10-CM | POA: Diagnosis not present

## 2020-04-30 DIAGNOSIS — R102 Pelvic and perineal pain: Secondary | ICD-10-CM | POA: Diagnosis not present

## 2020-04-30 LAB — POC URINALSYSI DIPSTICK (AUTOMATED)
Bilirubin, UA: NEGATIVE
Blood, UA: NEGATIVE
Glucose, UA: NEGATIVE
Ketones, UA: NEGATIVE
Leukocytes, UA: NEGATIVE
Nitrite, UA: NEGATIVE
Protein, UA: NEGATIVE
Spec Grav, UA: 1.02 (ref 1.010–1.025)
Urobilinogen, UA: 0.2 E.U./dL
pH, UA: 6.5 (ref 5.0–8.0)

## 2020-04-30 MED ORDER — NAPROXEN 500 MG PO TABS: ORAL_TABLET | ORAL | 0 refills | Status: DC

## 2020-04-30 NOTE — Patient Instructions (Addendum)
Exam overall ok today, urine looking normal today.  Possible prostate inflammation - treat with naprosyn 500mg  twice daily for 1 week. Push fluids, avoid bladder irritants like caffeine, spicy foods, sodas. Let us know if not better with this, we may consider antibiotic course.   Prostatitis  Prostatitis is swelling or inflammation of the prostate gland. The prostate is a walnut-sized gland that is involved in the production of semen. It is located below a man's bladder, in front of the rectum. There are four types of prostatitis:  Chronic nonbacterial prostatitis. This is the most common type of prostatitis. It may be associated with a viral infection or autoimmune disorder.  Acute bacterial prostatitis. This is the least common type of prostatitis. It starts quickly and is usually associated with a bladder infection, high fever, and shaking chills. It can occur at any age.  Chronic bacterial prostatitis. This type usually results from acute bacterial prostatitis that happens repeatedly (is recurrent) or has not been treated properly. It can occur in men of any age but is most common among middle-aged men whose prostate has begun to get larger. The symptoms are not as severe as symptoms caused by acute bacterial prostatitis.  Prostatodynia or chronic pelvic pain syndrome (CPPS). This type is also called pelvic floor disorder. It is associated with increased muscular tone in the pelvis surrounding the prostate. What are the causes? Bacterial prostatitis is caused by infection from bacteria. Chronic nonbacterial prostatitis may be caused by:  Urinary tract infections (UTIs).  Nerve damage.  A response by the body's disease-fighting system (autoimmune response).  Chemicals in the urine. The causes of the other types of prostatitis are usually not known. What are the signs or symptoms? Symptoms of this condition vary depending upon the type of prostatitis. If you have acute bacterial  prostatitis, you may experience:  Urinary symptoms, such as: ? Painful urination. ? Burning during urination. ? Frequent and sudden urges to urinate. ? Inability to start urinating. ? A weak or interrupted stream of urine.  Vomiting.  Nausea.  Fever.  Chills.  Inability to empty the bladder completely.  Pain in the: ? Muscles or joints. ? Lower back. ? Lower abdomen. If you have any of the other types of prostatitis, you may experience:  Urinary symptoms, such as: ? Sudden urges to urinate. ? Frequent urination. ? Difficulty starting urination. ? Weak urine stream. ? Dribbling after urination.  Discharge from the urethra. The urethra is a tube that opens at the end of the penis.  Pain in the: ? Testicles. ? Penis or tip of the penis. ? Rectum. ? Area in front of the rectum and below the scrotum (perineum).  Problems with sexual function.  Painful ejaculation.  Bloody semen. How is this diagnosed? This condition may be diagnosed based on:  A physical and medical exam.  Your symptoms.  A urine test to check for bacteria.  An exam in which a health care provider uses a finger to feel the prostate (digital rectal exam).  A test of a sample of semen.  Blood tests.  Ultrasound.  Removal of prostate tissue to be examined under a microscope (biopsy).  Tests to check how your body handles urine (urodynamic tests).  A test to look inside your bladder or urethra (cystoscopy). How is this treated? Treatment for this condition depends on the type of prostatitis. Treatment may involve:  Medicines to relieve pain or inflammation.  Medicines to help relax your muscles.  Physical therapy.  Heat  therapy.  Techniques to help you control certain body functions (biofeedback).  Relaxation exercises.  Antibiotic medicine, if your condition is caused by bacteria.  Warm water baths (sitz baths). Sitz baths help with relaxing your pelvic floor muscles, which  helps to relieve pressure on the prostate. Follow these instructions at home:   Take over-the-counter and prescription medicines only as told by your health care provider.  If you were prescribed an antibiotic, take it as told by your health care provider. Do not stop taking the antibiotic even if you start to feel better.  If physical therapy, biofeedback, or relaxation exercises were prescribed, do exercises as instructed.  Take sitz baths as directed by your health care provider. For a sitz bath, sit in warm water that is deep enough to cover your hips and buttocks.  Keep all follow-up visits as told by your health care provider. This is important. Contact a health care provider if:  Your symptoms get worse.  You have a fever. Get help right away if:  You have chills.  You feel nauseous.  You vomit.  You feel light-headed or feel like you are going to faint.  You are unable to urinate.  You have blood or blood clots in your urine. This information is not intended to replace advice given to you by your health care provider. Make sure you discuss any questions you have with your health care provider. Document Revised: 08/25/2017 Document Reviewed: 03/02/2016 Elsevier Patient Education  [employer].

## 2020-04-30 NOTE — Progress Notes (Signed)
This visit was conducted in person.  BP 114/68 (BP Location: Left Arm, Patient Position: Sitting, Cuff Size: Normal)   Pulse 72   Temp 97.9 F (36.6 C) (Temporal)   Ht 5\' 11"  (1.803 m)   Wt 166 lb 3 oz (75.4 kg)   SpO2 98%   BMI 23.18 kg/m    CC: pain in penis Subjective:    Patient ID: Edward Lin, male    DOB: February 01, 1986, 34 y.o.   MRN: [id number]  HPI: Edward Lin is a 34 y.o. male presenting on 04/30/2020 for Penis Pain (C/o pain in penis radiating to anus and lower abd during intercourse.  Started about 3 wk.  H/o prostate problems. )   3 wk h/o pain during intercourse - started suddenly, starts at penis with radiation through perineum to anus as well as discomfort to lower abdomen. Erection is uncomfortable, sharp stabbing pain with ejaculation, 4-5/10 pain. Pain lingers for 4-5 minutes. No blood in semen or urine. No urethral discharge, dysuria, rash, or frequency/urgency.  No new sexual partners.  No fevers/chills, nausea.  S/p [facility]ctomy 09/2019 [facility]).  Has been treating with ibuprofen 600mg  BID and abstained from intercourse.  No new foods, no new supplements.   Around the same time this happened felt strain to lower back when lifting heavy tire - notes discomfort at bottom whenever he lifts since then.   H/o prostate inflammation due to heavy lifting 10+ yrs ago - resolved with rest.      Relevant past medical, surgical, family and social history reviewed and updated as indicated. Interim medical history since our last visit reviewed. Allergies and medications reviewed and updated. Outpatient Medications Prior to Visit  Medication Sig Dispense Refill  . loratadine (CLARITIN) 10 MG tablet Take 10 mg by mouth daily as needed for allergies.    Marland Kitchen amphetamine-dextroamphetamine (ADDERALL XR) 10 MG 24 hr capsule Take 1 capsule (10 mg total) by mouth daily. (Patient not taking: Reported on 04/30/2020) 30 capsule 0  . buPROPion (WELLBUTRIN SR) 100 MG 12 hr tablet  Take 1 tablet (100 mg total) by mouth daily. (Patient not taking: Reported on 04/30/2020) 30 tablet 3  . cetirizine (ZYRTEC) 10 MG tablet Take 10 mg by mouth daily. As needed seasonally    . HYDROcodone-acetaminophen (NORCO/VICODIN) 5-325 MG tablet Take 1 tablet by mouth every 4 (four) hours as needed for moderate pain. 10 tablet 0   No facility-administered medications prior to visit.     Per HPI unless specifically indicated in ROS section below Review of Systems Objective:  BP 114/68 (BP Location: Left Arm, Patient Position: Sitting, Cuff Size: Normal)   Pulse 72   Temp 97.9 F (36.6 C) (Temporal)   Ht 5\' 11"  (1.803 m)   Wt 166 lb 3 oz (75.4 kg)   SpO2 98%   BMI 23.18 kg/m   Wt Readings from Last 3 Encounters:  04/30/20 166 lb 3 oz (75.4 kg)  10/16/19 158 lb (71.7 kg)  08/04/19 160 lb (72.6 kg)      Physical Exam Vitals and nursing note reviewed.  Constitutional:      Appearance: Normal appearance. He is not ill-appearing.  Abdominal:     General: Abdomen is flat. Bowel sounds are normal. There is no distension.     Palpations: Abdomen is soft. There is no mass.     Tenderness: There is no abdominal tenderness. There is no guarding or rebound.     Hernia: No hernia is present. There is  no hernia in the left inguinal area or right inguinal area.  Genitourinary:    Pubic Area: No rash.      Penis: Normal and circumcised.      Testes: Normal.        Right: Mass, tenderness or swelling not present. Right testis is descended.        Left: Mass, tenderness or swelling not present. Left testis is descended.     Epididymis:     Right: Normal.     Left: Normal.     Prostate: Normal. Not enlarged (20gm), not tender and no nodules present.     Rectum: Normal. No mass, tenderness, anal fissure, external hemorrhoid or internal hemorrhoid. Normal anal tone.     Comments: Slightly boggy prostate, nontender Lymphadenopathy:     Lower Body: No right inguinal adenopathy. No left  inguinal adenopathy.  Neurological:     Mental Status: He is alert.       Results for orders placed or performed in visit on 04/30/20  POCT Urinalysis Dipstick (Automated)  Result Value Ref Range   Color, UA light yellow    Clarity, UA clear    Glucose, UA Negative Negative   Bilirubin, UA negative    Ketones, UA negative    Spec Grav, UA 1.020 1.010 - 1.025   Blood, UA negative    pH, UA 6.5 5.0 - 8.0   Protein, UA Negative Negative   Urobilinogen, UA 0.2 0.2 or 1.0 E.U./dL   Nitrite, UA negative    Leukocytes, UA Negative Negative   Assessment & Plan:  This visit occurred during the SARS-CoV-2 public health emergency.  Safety protocols were in place, including screening questions prior to the visit, additional usage of staff PPE, and extensive cleaning of exam room while observing appropriate contact time as indicated for disinfecting solutions.   Problem List Items Addressed This Visit    Perineal pain in male - Primary    Anticipate non infectious prostatitis. Treat with naprosyn 500mg  bid x 1 wk with meals then PRN. Discussed pushing water and avoiding bladder irritants. UA normal, UCx sent.  If ongoing symptoms, low threshold to cover with 2 wk bactrim course.  No concern for STI.       Relevant Orders   Urine Culture    Other Visit Diagnoses    Penis pain       Relevant Orders   POCT Urinalysis Dipstick (Automated) (Completed)       Meds ordered this encounter  Medications  . naproxen (NAPROSYN) 500 MG tablet    Sig: Take one po bid x 1 week then prn pain, take with food    Dispense:  40 tablet    Refill:  0   Orders Placed This Encounter  Procedures  . Urine Culture  . POCT Urinalysis Dipstick (Automated)    Patient instructions: Exam overall ok today, urine looking normal today.  Possible prostate inflammation - treat with naprosyn 500mg  twice daily for 1 week. Push fluids, avoid bladder irritants like caffeine, spicy foods, sodas. Let us know if not  better with this, we may consider antibiotic course.   Follow up plan: Return if symptoms worsen or fail to improve.  Ria Bush, MD

## 2020-04-30 NOTE — Assessment & Plan Note (Addendum)
Anticipate non infectious prostatitis. Treat with naprosyn 500mg  bid x 1 wk with meals then PRN. Discussed pushing water and avoiding bladder irritants. UA normal, UCx sent.  If ongoing symptoms, low threshold to cover with 2 wk bactrim course.  No concern for STI.

## 2020-05-01 LAB — URINE CULTURE
MICRO NUMBER:: 11166682
Result:: NO GROWTH
SPECIMEN QUALITY:: ADEQUATE

## 2020-05-31 ENCOUNTER — Encounter: Payer: Self-pay | Admitting: Family Medicine

## 2020-05-31 MED ORDER — SULFAMETHOXAZOLE-TRIMETHOPRIM 800-160 MG PO TABS: 1.0000 | ORAL_TABLET | Freq: Two times a day (BID) | ORAL | 0 refills | Status: DC

## 2020-05-31 MED ORDER — NAPROXEN 500 MG PO TABS: ORAL_TABLET | ORAL | 1 refills | Status: DC

## 2020-06-21 ENCOUNTER — Encounter: Payer: Self-pay | Admitting: Family Medicine

## 2020-06-21 DIAGNOSIS — R102 Pelvic and perineal pain: Secondary | ICD-10-CM

## 2020-07-01 ENCOUNTER — Encounter: Payer: Self-pay | Admitting: Urology

## 2020-07-01 ENCOUNTER — Ambulatory Visit: Payer: Self-pay | Admitting: Urology

## 2020-07-01 ENCOUNTER — Ambulatory Visit (INDEPENDENT_AMBULATORY_CARE_PROVIDER_SITE_OTHER): Payer: Self-pay | Admitting: Urology

## 2020-07-01 ENCOUNTER — Other Ambulatory Visit: Payer: Self-pay

## 2020-07-01 VITALS — BP 115/71 | HR 69 | Ht 71.0 in | Wt 162.0 lb

## 2020-07-01 DIAGNOSIS — N411 Chronic prostatitis: Secondary | ICD-10-CM

## 2020-07-01 DIAGNOSIS — G894 Chronic pain syndrome: Secondary | ICD-10-CM

## 2020-07-01 MED ORDER — TAMSULOSIN HCL 0.4 MG PO CAPS: 0.4000 mg | ORAL_CAPSULE | Freq: Every day | ORAL | 6 refills | Status: DC

## 2020-07-01 NOTE — Progress Notes (Signed)
07/01/2020 1:09 PM   Edward Lin [birth year] [id number]  Referring provider: Ria Bush, MD [address] [place],  [place] [address]  Chief Complaint  Patient presents with  . Penis Pain    HPI: 35 y.o. male who presents for evaluation of pelvic pain and pain with ejaculation.   I saw in April 2021 for vasectomy  In late October 2021 had onset of penile pain with radiation to the scrotum, perineum and rectum.  He had mild increased pain with erections and painful ejaculation  He saw Dr. Danise Mina in early November and was started on Naprosyn.  He states the Naprosyn resolved his pain however when he discontinued this medication his symptoms recurred  He had a negative urinalysis and negative urine culture  Was given an empiric course of Septra DS without improvement in his symptoms  No bothersome LUTS  Denies dysuria or gross hematuria   PMH: Past Medical History:  Diagnosis Date  . Chicken pox   . Heart palpitations 01/2011    negative evaluation , Dr Stanford Breed  . History of depression   . 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 Syncope    X1 with venopuncture (vasovagal reaction)    Surgical History: Past Surgical History:  Procedure Laterality Date  . APPENDECTOMY  1990  . WISDOM TOOTH EXTRACTION      Home Medications:  Allergies as of 07/01/2020      Reactions   Penicillins    Rash      Medication List       Accurate as of July 01, 2020  1:09 PM. If you have any questions, ask your nurse or doctor.        STOP taking these medications   amphetamine-dextroamphetamine 10 MG 24 hr capsule Commonly known as: ADDERALL XR Stopped by: Abbie Sons, MD   buPROPion 100 MG 12 hr tablet Commonly known as: Wellbutrin SR Stopped by: Abbie Sons, MD   sulfamethoxazole-trimethoprim 800-160 MG tablet Commonly known as: BACTRIM DS Stopped by: Abbie Sons, MD     TAKE these medications   loratadine 10 MG tablet Commonly known as:  CLARITIN Take 10 mg by mouth daily as needed for allergies.   naproxen 500 MG tablet Commonly known as: Naprosyn Take one po bid x 1 week then prn pain, take with food   tamsulosin 0.4 MG Caps capsule Commonly known as: FLOMAX Take 1 capsule (0.4 mg total) by mouth daily. Started by: Abbie Sons, MD       Allergies:  Allergies  Allergen Reactions  . Penicillins     Rash     Family History: Family History  Problem Relation Age of Onset  . Deep vein thrombosis Father        factor V deficiency  . Pancreatic cancer Father   . Diabetes Paternal Grandfather   . Cancer Maternal Grandfather        ? prostate  . Cancer Maternal Uncle        esophageal  . Heart attack Paternal Uncle 62  . Stroke Neg Hx     Social History:  reports that he has never smoked. He has never used smokeless tobacco. He reports current alcohol use. He reports that he does not use drugs.   Physical Exam: BP 115/71   Pulse 69   Ht 5\' 11"  (1.803 m)   Wt 162 lb (73.5 kg)   BMI 22.59 kg/m   Constitutional:  Alert and oriented, No acute  distress. HEENT: [place] AT, moist mucus membranes.  Trachea midline, no masses. Cardiovascular: No clubbing, cyanosis, or edema. Respiratory: Normal respiratory effort, no increased work of breathing. GI: Abdomen is soft, nontender, nondistended, no abdominal masses GU: Phallus without lesions, testes descended bilateral without masses or tenderness.  Spermatic cord/epididymis palpably normal bilaterally.  Prostate 25 g, boggy and moderately tender; right peri-prostatic tenderness pelvic floor  Assessment & Plan:    1.  Chronic prostatitis/chronic pelvic pain syndrome  Discuss the common occurrence though the etiology is unknown  Continue Naprosyn as needed  Trial tamsulosin 0.4 mg daily x30 days  If no improvement in symptoms after 30 days of tamsulosin he was directed to call back and will refer for pelvic floor physical therapy   Riki Altes,  MD  [facility] [address] [place], [place] [address] [phone]

## 2020-09-09 ENCOUNTER — Telehealth: Payer: Self-pay

## 2020-09-09 DIAGNOSIS — G8929 Other chronic pain: Secondary | ICD-10-CM

## 2020-09-09 NOTE — Telephone Encounter (Signed)
Incoming call from pt on triage line who states that his pain has returned. He noticed that pain increased las week after some heavy lifting. Advised pt that per Dr. Dene Gentry last note pt is to have referral placed for PT if pain continues. Pt agrees to referral for physical therapy, requests office in [place].

## 2021-03-31 ENCOUNTER — Other Ambulatory Visit: Payer: Self-pay | Admitting: Family Medicine

## 2021-03-31 NOTE — Telephone Encounter (Signed)
Spoke with pt asking about request.  States he only takes med to when taking classes.  Says he's currently taking classes with his job.  Has a few pills from old rx and t knows it's been awhile since he's been seen.  Scheduled labs on 04/12/21 at 7:30 and CPE. On 04/20/21 at 4:00.  Name of Medication: Adderall XR Name of Pharmacy: [facility] or Written Date and Quantity: 05/09/19, #30 Last Office Visit and Type: 04/30/20, penis pain Next Office Visit and Type: 04/20/21, CPE Last Controlled Substance Agreement Date: none Last UDS: none

## 2021-04-01 NOTE — Telephone Encounter (Signed)
ERx 

## 2021-04-12 ENCOUNTER — Other Ambulatory Visit: Payer: BC Managed Care – PPO

## 2021-04-20 ENCOUNTER — Encounter: Payer: Self-pay | Admitting: Family Medicine

## 2021-04-20 ENCOUNTER — Ambulatory Visit (INDEPENDENT_AMBULATORY_CARE_PROVIDER_SITE_OTHER): Payer: BC Managed Care – PPO | Admitting: Family Medicine

## 2021-04-20 ENCOUNTER — Encounter: Payer: BC Managed Care – PPO | Admitting: Family Medicine

## 2021-04-20 ENCOUNTER — Other Ambulatory Visit: Payer: Self-pay

## 2021-04-20 VITALS — BP 118/60 | HR 69 | Temp 97.5°F | Ht 71.0 in | Wt 167.0 lb

## 2021-04-20 DIAGNOSIS — M25511 Pain in right shoulder: Secondary | ICD-10-CM | POA: Diagnosis not present

## 2021-04-20 DIAGNOSIS — R946 Abnormal results of thyroid function studies: Secondary | ICD-10-CM

## 2021-04-20 DIAGNOSIS — Z0000 Encounter for general adult medical examination without abnormal findings: Secondary | ICD-10-CM | POA: Diagnosis not present

## 2021-04-20 DIAGNOSIS — E785 Hyperlipidemia, unspecified: Secondary | ICD-10-CM

## 2021-04-20 DIAGNOSIS — F988 Other specified behavioral and emotional disorders with onset usually occurring in childhood and adolescence: Secondary | ICD-10-CM | POA: Diagnosis not present

## 2021-04-20 LAB — BASIC METABOLIC PANEL
BUN: 16 mg/dL (ref 6–23)
CO2: 29 mEq/L (ref 19–32)
Calcium: 9.3 mg/dL (ref 8.4–10.5)
Chloride: 103 mEq/L (ref 96–112)
Creatinine, Ser: 0.97 mg/dL (ref 0.40–1.50)
GFR: 101.52 mL/min (ref >60.00)
Glucose, Bld: 88 mg/dL (ref 70–99)
Potassium: 3.9 mEq/L (ref 3.5–5.1)
Sodium: 138 mEq/L (ref 135–145)

## 2021-04-20 LAB — LIPID PANEL
Cholesterol: 201 mg/dL — ABNORMAL HIGH (ref 0–200)
HDL: 67.9 mg/dL (ref >39.00)
LDL Cholesterol: 123 mg/dL — ABNORMAL HIGH (ref 0–99)
NonHDL: 132.85
Total CHOL/HDL Ratio: 3
Triglycerides: 48 mg/dL (ref 0.0–149.0)
VLDL: 9.6 mg/dL (ref 0.0–40.0)

## 2021-04-20 LAB — TSH: TSH: 0.47 u[IU]/mL (ref 0.35–5.50)

## 2021-04-20 LAB — T4, FREE: Free T4: 0.98 ng/dL (ref 0.60–1.60)

## 2021-04-20 NOTE — Patient Instructions (Addendum)
Labs today  Good to see you today. Return as needed or in 1 year for next physical.   Health Maintenance, Male Adopting a healthy lifestyle and getting preventive care are important in promoting health and wellness. Ask your health care provider about: The right schedule for you to have regular tests and exams. Things you can do on your own to prevent diseases and keep yourself healthy. What should I know about diet, weight, and exercise? Eat a healthy diet  Eat a diet that includes plenty of vegetables, fruits, low-fat dairy products, and lean protein. Do not eat a lot of foods that are high in solid fats, added sugars, or sodium. Maintain a healthy weight Body mass index (BMI) is a measurement that can be used to identify possible weight problems. It estimates body fat based on height and weight. Your health care provider can help determine your BMI and help you achieve or maintain a healthy weight. Get regular exercise Get regular exercise. This is one of the most important things you can do for your health. Most adults should: Exercise for at least 150 minutes each week. The exercise should increase your heart rate and make you sweat (moderate-intensity exercise). Do strengthening exercises at least twice a week. This is in addition to the moderate-intensity exercise. Spend less time sitting. Even light physical activity can be beneficial. Watch cholesterol and blood lipids Have your blood tested for lipids and cholesterol at 35 years of age, then have this test every 5 years. You may need to have your cholesterol levels checked more often if: Your lipid or cholesterol levels are high. You are older than 35 years of age. You are at high risk for heart disease. What should I know about cancer screening? Many types of cancers can be detected early and may often be prevented. Depending on your health history and family history, you may need to have cancer screening at various ages. This  may include screening for: Colorectal cancer. Prostate cancer. Skin cancer. Lung cancer. What should I know about heart disease, diabetes, and high blood pressure? Blood pressure and heart disease High blood pressure causes heart disease and increases the risk of stroke. This is more likely to develop in people who have high blood pressure readings, are of African descent, or are overweight. Talk with your health care provider about your target blood pressure readings. Have your blood pressure checked: Every 3-5 years if you are 9-20 years of age. Every year if you are 23 years old or older. If you are between the ages of 19 and 39 and are a current or former smoker, ask your health care provider if you should have a one-time screening for abdominal aortic aneurysm (AAA). Diabetes Have regular diabetes screenings. This checks your fasting blood sugar level. Have the screening done: Once every three years after age 12 if you are at a normal weight and have a low risk for diabetes. More often and at a younger age if you are overweight or have a high risk for diabetes. What should I know about preventing infection? Hepatitis B If you have a higher risk for hepatitis B, you should be screened for this virus. Talk with your health care provider to find out if you are at risk for hepatitis B infection. Hepatitis C Blood testing is recommended for: Everyone born from [birth year] through 1965. Anyone with known risk factors for hepatitis C. Sexually transmitted infections (STIs) You should be screened each year for STIs, including gonorrhea and  chlamydia, if: You are sexually active and are younger than 35 years of age. You are older than 35 years of age and your health care provider tells you that you are at risk for this type of infection. Your sexual activity has changed since you were last screened, and you are at increased risk for chlamydia or gonorrhea. Ask your health care provider if you are  at risk. Ask your health care provider about whether you are at high risk for HIV. Your health care provider may recommend a prescription medicine to help prevent HIV infection. If you choose to take medicine to prevent HIV, you should first get tested for HIV. You should then be tested every 3 months for as long as you are taking the medicine. Follow these instructions at home: Lifestyle Do not use any products that contain nicotine or tobacco, such as cigarettes, e-cigarettes, and chewing tobacco. If you need help quitting, ask your health care provider. Do not use street drugs. Do not share needles. Ask your health care provider for help if you need support or information about quitting drugs. Alcohol use Do not drink alcohol if your health care provider tells you not to drink. If you drink alcohol: Limit how much you have to 0-2 drinks a day. Be aware of how much alcohol is in your drink. In the [place] one drink equals one 12 oz bottle of beer (355 mL), one 5 oz glass of wine (148 mL), or one 1 oz glass of hard liquor (44 mL). General instructions Schedule regular health, dental, and eye exams. Stay current with your vaccines. Tell your health care provider if: You often feel depressed. You have ever been abused or do not feel safe at home. Summary Adopting a healthy lifestyle and getting preventive care are important in promoting health and wellness. Follow your health care provider's instructions about healthy diet, exercising, and getting tested or screened for diseases. Follow your health care provider's instructions on monitoring your cholesterol and blood pressure. This information is not intended to replace advice given to you by your health care provider. Make sure you discuss any questions you have with your health care provider. Document Revised: 08/20/2020 Document Reviewed: 06/05/2018 Elsevier Patient Education  2022 [employer].

## 2021-04-20 NOTE — Assessment & Plan Note (Addendum)
Exam consistent with R RTC strain (likely supraspinatus). Reviewed with patient. Handout provided along with resistance band to use at home. May continue ibuprofen use as well as ice/heat. Update if not improving with treatment. Pt agrees with plan.

## 2021-04-20 NOTE — Assessment & Plan Note (Signed)
Preventative protocols reviewed and updated unless pt declined. Discussed healthy diet and lifestyle.  

## 2021-04-20 NOTE — Progress Notes (Signed)
Patient ID: Edward Lin, male    DOB: 03-Jun-1986, 35 y.o.   MRN: [id number]  This visit was conducted in person.  BP 118/60 (BP Location: Left Arm, Patient Position: Sitting, Cuff Size: Normal)   Pulse 69   Temp (!) 97.5 F (36.4 C) (Temporal)   Ht 5\' 11"  (1.803 m)   Wt 167 lb (75.8 kg)   SpO2 99%   BMI 23.29 kg/m    CC: CPE Subjective:   HPI: Edward Lin is a 35 y.o. male presenting on 04/20/2021 for Annual Exam (Patient is also having right shoulder pain)   ADHD - on adderall XR 10mg  daily, tolerating well. Uses very intermittently, currently using to study for [employer] certification.   No recent pelvic pain - has seen urology manages with flomax and naprosyn.   R shoulder pain -  intermittent over several months. Recently seems to be improving. Tender area anterior shoulder/clavicle with radiation to posterior shoulder. Treating with ibuprofen as well. Denies inciting injury or fall. He has physical job Tax inspector)  Preventative: Flu shot - declines COVID - declines Tdap 2016  Seat belt use discussed  Sunscreen use discussed. No changing moles on skin.  Sleep - averaging 6-7 hours/night Non smoker Alcohol - rare Dentist q6 mo  Eye exam yearly - new glasses since summer 2021 found to have astigmatism to left eye   Lives with wife Edward Lin) and son and daughter Occ: Dealer Fun/Hobby: Sleep, Snowboard  Exercise: no regular exercise - time constraints Diet: good water, fruits/vegetables daily, enjoys cheer wine      Relevant past medical, surgical, family and social history reviewed and updated as indicated. Interim medical history since our last visit reviewed. Allergies and medications reviewed and updated. Outpatient Medications Prior to Visit  Medication Sig Dispense Refill   amphetamine-dextroamphetamine (ADDERALL XR) 10 MG 24 hr capsule TAKE 1 CAPSULE BY MOUTH DAILY 30 capsule 0   loratadine (CLARITIN) 10 MG tablet Take 10 mg by mouth daily as  needed for allergies.     naproxen (NAPROSYN) 500 MG tablet Take one po bid x 1 week then prn pain, take with food 40 tablet 1   tamsulosin (FLOMAX) 0.4 MG CAPS capsule Take 1 capsule (0.4 mg total) by mouth daily. (Patient not taking: Reported on 04/20/2021) 30 capsule 6   No facility-administered medications prior to visit.     Per HPI unless specifically indicated in ROS section below Review of Systems  Constitutional:  Negative for activity change, appetite change, chills, fatigue, fever and unexpected weight change.  HENT:  Negative for hearing loss.   Eyes:  Negative for visual disturbance.  Respiratory:  Negative for cough, chest tightness, shortness of breath and wheezing.   Cardiovascular:  Negative for chest pain, palpitations and leg swelling.  Gastrointestinal:  Negative for abdominal distention, abdominal pain, blood in stool, constipation, diarrhea, nausea and vomiting.  Genitourinary:  Negative for difficulty urinating and hematuria.  Musculoskeletal:  Negative for arthralgias, myalgias and neck pain.  Skin:  Negative for rash.  Neurological:  Negative for dizziness, seizures, syncope and headaches.  Hematological:  Negative for adenopathy. Does not bruise/bleed easily.  Psychiatric/Behavioral:  Negative for dysphoric mood. The patient is not nervous/anxious.    Objective:  BP 118/60 (BP Location: Left Arm, Patient Position: Sitting, Cuff Size: Normal)   Pulse 69   Temp (!) 97.5 F (36.4 C) (Temporal)   Ht 5\' 11"  (1.803 m)   Wt 167 lb (75.8 kg)   SpO2  99%   BMI 23.29 kg/m   Wt Readings from Last 3 Encounters:  04/20/21 167 lb (75.8 kg)  07/01/20 162 lb (73.5 kg)  04/30/20 166 lb 3 oz (75.4 kg)      Physical Exam Vitals and nursing note reviewed.  Constitutional:      General: He is not in acute distress.    Appearance: Normal appearance. He is well-developed. He is not ill-appearing.  HENT:     Head: Normocephalic and atraumatic.     Right Ear: Hearing,  tympanic membrane, ear canal and external ear normal.     Left Ear: Hearing, tympanic membrane, ear canal and external ear normal.  Eyes:     General: No scleral icterus.    Extraocular Movements: Extraocular movements intact.     Conjunctiva/sclera: Conjunctivae normal.     Pupils: Pupils are equal, round, and reactive to light.  Neck:     Thyroid: No thyroid mass or thyromegaly.  Cardiovascular:     Rate and Rhythm: Normal rate and regular rhythm.     Pulses: Normal pulses.          Radial pulses are 2+ on the right side and 2+ on the left side.     Heart sounds: Normal heart sounds. No murmur heard. Pulmonary:     Effort: Pulmonary effort is normal. No respiratory distress.     Breath sounds: Normal breath sounds. No wheezing, rhonchi or rales.  Abdominal:     General: Bowel sounds are normal. There is no distension.     Palpations: Abdomen is soft. There is no mass.     Tenderness: There is no abdominal tenderness. There is no guarding or rebound.     Hernia: No hernia is present.  Musculoskeletal:        General: Normal range of motion.     Cervical back: Normal range of motion and neck supple.     Right lower leg: No edema.     Left lower leg: No edema.     Comments:  L shoulder WNL R shoulder exam: No deformity of shoulders on inspection. No pain with palpation of shoulder landmarks. FROM in abduction and forward flexion. No pain or weakness with testing SITS in ext/int rotation. Discomfort with empty can sign. Neg Speed test. No impingement. No pain with crossover test. No pain with rotation of humeral head in [place] joint.   Lymphadenopathy:     Cervical: No cervical adenopathy.  Skin:    General: Skin is warm and dry.     Findings: No rash.  Neurological:     General: No focal deficit present.     Mental Status: He is alert and oriented to person, place, and time.  Psychiatric:        Mood and Affect: Mood normal.        Behavior: Behavior normal.         Thought Content: Thought content normal.        Judgment: Judgment normal.      Results for orders placed or performed in visit on 04/30/20  Urine Culture   Specimen: Urine  Result Value Ref Range   MICRO NUMBER: [id number]    SPECIMEN QUALITY: Adequate    Sample Source URINE    STATUS: FINAL    Result: No Growth   POCT Urinalysis Dipstick (Automated)  Result Value Ref Range   Color, UA light yellow    Clarity, UA clear    Glucose, UA Negative Negative  Bilirubin, UA negative    Ketones, UA negative    Spec Grav, UA 1.020 1.010 - 1.025   Blood, UA negative    pH, UA 6.5 5.0 - 8.0   Protein, UA Negative Negative   Urobilinogen, UA 0.2 0.2 or 1.0 E.U./dL   Nitrite, UA negative    Leukocytes, UA Negative Negative    Assessment & Plan:  This visit occurred during the SARS-CoV-2 public health emergency.  Safety protocols were in place, including screening questions prior to the visit, additional usage of staff PPE, and extensive cleaning of exam room while observing appropriate contact time as indicated for disinfecting solutions.   Problem List Items Addressed This Visit     Health maintenance examination - Primary (Chronic)    Preventative protocols reviewed and updated unless pt declined. Discussed healthy diet and lifestyle.       Attention deficit disorder (ADD) in adult    Stable period. Continue PRN adderall XR. Uses very intermittently.      Right shoulder pain    Exam consistent with R RTC strain (likely supraspinatus). Reviewed with patient. Handout provided along with resistance band to use at home. May continue ibuprofen use as well as ice/heat. Update if not improving with treatment. Pt agrees with plan.       Other Visit Diagnoses     Hyperlipidemia, unspecified hyperlipidemia type       Relevant Orders   Lipid panel   Basic metabolic panel   Abnormal thyroid function test       Relevant Orders   TSH   T4, free        No orders of the defined  types were placed in this encounter.  Orders Placed This Encounter  Procedures   TSH   Lipid panel   Basic metabolic panel   T4, free    Patient instructions: Labs today  Good to see you today. Return as needed or in 1 year for next physical.   Follow up plan: Return in about 1 year (around 04/20/2022) for annual exam, prior fasting for blood work.  Ria Bush, MD

## 2021-04-20 NOTE — Assessment & Plan Note (Signed)
Stable period. Continue PRN adderall XR. Uses very intermittently.

## 2021-05-30 ENCOUNTER — Ambulatory Visit (INDEPENDENT_AMBULATORY_CARE_PROVIDER_SITE_OTHER): Payer: BC Managed Care – PPO | Admitting: Family Medicine

## 2021-05-30 ENCOUNTER — Encounter: Payer: Self-pay | Admitting: Family Medicine

## 2021-05-30 ENCOUNTER — Other Ambulatory Visit: Payer: Self-pay

## 2021-05-30 VITALS — BP 116/66 | HR 65 | Temp 97.6°F | Ht 71.0 in | Wt 165.4 lb

## 2021-05-30 DIAGNOSIS — R1013 Epigastric pain: Secondary | ICD-10-CM | POA: Diagnosis not present

## 2021-05-30 MED ORDER — OMEPRAZOLE 40 MG PO CPDR: DELAYED_RELEASE_CAPSULE | ORAL | 2 refills | Status: DC

## 2021-05-30 MED ORDER — AMPHETAMINE-DEXTROAMPHET ER 10 MG PO CP24: 10.0000 mg | ORAL_CAPSULE | Freq: Every day | ORAL | 0 refills | Status: DC

## 2021-05-30 NOTE — Progress Notes (Signed)
Patient ID: Edward Lin, male    DOB: 1985/08/27, 35 y.o.   MRN: [id number]  This visit was conducted in person.  BP 116/66   Pulse 65   Temp 97.6 F (36.4 C) (Temporal)   Ht 5\' 11"  (1.803 m)   Wt 165 lb 7 oz (75 kg)   SpO2 97%   BMI 23.07 kg/m    CC: abd pain after eating Subjective:   HPI: Edward Lin is a 35 y.o. male presenting on 05/30/2021 for Abdominal Pain (C/o abd tightness after eating.  Sxs stared few wks.  Also, had some nausea.  Feels like acid reflux.  Usually takes OTC omeprazole PRN.  Tried taking nightly, not helpful. )   3 week h/o abdominal discomfort, described as epigastric knot tightness, after meals, felt fuller faster. That has since improved but then for the past week he's been taking omeprazole daily. Normally takes OTC PPI intermittently. Now after eating feels indigestion, burning discomfort. Some nausea. Tums not helping as much as normally.  Symptoms more noticeable with red meat ie steak meal.  He enjoys jalapeos and ate more recently - home grown this summer.   No reflux or water brash.  No recent diet changes.  No fevers/chills, bowel changes.  No dysphagia, severe early satiety, vomiting.   No significant ibuprofen use.  Caffeine - 12 oz coffee.  Alcohol - 1-2 beers/wk, has limited due to GI symptoms.   Wants to r/o gallstone issue.   On PRN adderall for ADHD symptoms - uses rarely. Doesn't note relation of abdominal symptoms to stimulant use.      Relevant past medical, surgical, family and social history reviewed and updated as indicated. Interim medical history since our last visit reviewed. Allergies and medications reviewed and updated. Outpatient Medications Prior to Visit  Medication Sig Dispense Refill   loratadine (CLARITIN) 10 MG tablet Take 10 mg by mouth daily as needed for allergies.     amphetamine-dextroamphetamine (ADDERALL XR) 10 MG 24 hr capsule TAKE 1 CAPSULE BY MOUTH DAILY 30 capsule 0   No  facility-administered medications prior to visit.     Per HPI unless specifically indicated in ROS section below Review of Systems  Objective:  BP 116/66   Pulse 65   Temp 97.6 F (36.4 C) (Temporal)   Ht 5\' 11"  (1.803 m)   Wt 165 lb 7 oz (75 kg)   SpO2 97%   BMI 23.07 kg/m   Wt Readings from Last 3 Encounters:  05/30/21 165 lb 7 oz (75 kg)  04/20/21 167 lb (75.8 kg)  07/01/20 162 lb (73.5 kg)      Physical Exam Vitals and nursing note reviewed.  Constitutional:      Appearance: Normal appearance. He is not ill-appearing.  Cardiovascular:     Rate and Rhythm: Normal rate and regular rhythm.     Pulses: Normal pulses.     Heart sounds: Normal heart sounds. No murmur heard. Pulmonary:     Effort: Pulmonary effort is normal. No respiratory distress.     Breath sounds: Normal breath sounds. No wheezing, rhonchi or rales.  Abdominal:     General: Bowel sounds are normal. There is no distension.     Palpations: Abdomen is soft. There is no mass.     Tenderness: There is no abdominal tenderness. There is no right CVA tenderness, left CVA tenderness, guarding or rebound.     Hernia: No hernia is present.  Musculoskeletal:  Right lower leg: No edema.     Left lower leg: No edema.  Skin:    General: Skin is warm and dry.     Findings: No rash.  Neurological:     Mental Status: He is alert.  Psychiatric:        Mood and Affect: Mood normal.        Behavior: Behavior normal.      Results for orders placed or performed in visit on 04/20/21  TSH  Result Value Ref Range   TSH 0.47 0.35 - 5.50 uIU/mL  Lipid panel  Result Value Ref Range   Cholesterol 201 (H) 0 - 200 mg/dL   Triglycerides 48.0 0.0 - 149.0 mg/dL   HDL 67.90 >39.00 mg/dL   VLDL 9.6 0.0 - 40.0 mg/dL   LDL Cholesterol 123 (H) 0 - 99 mg/dL   Total CHOL/HDL Ratio 3    NonHDL 412.87   Basic metabolic panel  Result Value Ref Range   Sodium 138 135 - 145 mEq/L   Potassium 3.9 3.5 - 5.1 mEq/L   Chloride  103 96 - 112 mEq/L   CO2 29 19 - 32 mEq/L   Glucose, Bld 88 70 - 99 mg/dL   BUN 16 6 - 23 mg/dL   Creatinine, Ser 0.97 0.40 - 1.50 mg/dL   GFR 101.52 >60.00 mL/min   Calcium 9.3 8.4 - 10.5 mg/dL  T4, free  Result Value Ref Range   Free T4 0.98 0.60 - 1.60 ng/dL    Assessment & Plan:  This visit occurred during the SARS-CoV-2 public health emergency.  Safety protocols were in place, including screening questions prior to the visit, additional usage of staff PPE, and extensive cleaning of exam room while observing appropriate contact time as indicated for disinfecting solutions.   Problem List Items Addressed This Visit     Abdominal discomfort, epigastric - Primary    Postprandial epigastric fullness/knot associated with indigestion and burning discomfort. Anticipate GERD related symptoms r/o gastritis, gallstone disease, other. Check labs today. Start omeprazole 40mg  daily x3 wks then PRN. Update if not improving with this, consider abd [place] to evaluate for gallstones. Discussed limiting alcohol, NSAIDs, caffeine.  No red flags.       Relevant Orders   CBC with Differential/Platelet   Hepatic function panel   Lipase     Meds ordered this encounter  Medications   amphetamine-dextroamphetamine (ADDERALL XR) 10 MG 24 hr capsule    Sig: Take 1 capsule (10 mg total) by mouth daily.    Dispense:  30 capsule    Refill:  0   omeprazole (PRILOSEC) 40 MG capsule    Sig: Take 1 capsule (40mg ) daily for 3 weeks then as needed    Dispense:  30 capsule    Refill:  2    Orders Placed This Encounter  Procedures   CBC with Differential/Platelet   Hepatic function panel   Lipase     Patient Instructions  Good to see you today.  Labs today  Take omeprazole 40mg  daily for 3 weeks then as needed.  Let us know if ongoing symptoms despite this to consider abdominal ultrasound.  GERD precautions: Head of bed elevated. Avoidance of citrus, fatty foods, chocolate, peppermint, and excessive  alcohol, along with sodas, orange juice (acidic drinks) At least a few hours between dinner and bed, minimize naps after eating.   Follow up plan: Return if symptoms worsen or fail to improve.  Ria Bush, MD

## 2021-05-30 NOTE — Patient Instructions (Addendum)
Good to see you today.  Labs today  Take omeprazole 40mg  daily for 3 weeks then as needed.  Let us know if ongoing symptoms despite this to consider abdominal ultrasound.  GERD precautions: Head of bed elevated. Avoidance of citrus, fatty foods, chocolate, peppermint, and excessive alcohol, along with sodas, orange juice (acidic drinks) At least a few hours between dinner and bed, minimize naps after eating.

## 2021-05-31 LAB — CBC WITH DIFFERENTIAL/PLATELET
Basophils Absolute: 0.1 10*3/uL (ref 0.0–0.1)
Basophils Relative: 1 % (ref 0.0–3.0)
Eosinophils Absolute: 0.1 10*3/uL (ref 0.0–0.7)
Eosinophils Relative: 1.6 % (ref 0.0–5.0)
HCT: 42.9 % (ref 39.0–52.0)
Hemoglobin: 14.2 g/dL (ref 13.0–17.0)
Lymphocytes Relative: 24.1 % (ref 12.0–46.0)
Lymphs Abs: 1.7 10*3/uL (ref 0.7–4.0)
MCHC: 33.1 g/dL (ref 30.0–36.0)
MCV: 86.2 fl (ref 78.0–100.0)
Monocytes Absolute: 0.6 10*3/uL (ref 0.1–1.0)
Monocytes Relative: 8.6 % (ref 3.0–12.0)
Neutro Abs: 4.5 10*3/uL (ref 1.4–7.7)
Neutrophils Relative %: 64.7 % (ref 43.0–77.0)
Platelets: 168 10*3/uL (ref 150.0–400.0)
RBC: 4.98 Mil/uL (ref 4.22–5.81)
RDW: 13.2 % (ref 11.5–15.5)
WBC: 6.9 10*3/uL (ref 4.0–10.5)

## 2021-05-31 LAB — HEPATIC FUNCTION PANEL
ALT: 20 U/L (ref 0–53)
AST: 20 U/L (ref 0–37)
Albumin: 4.8 g/dL (ref 3.5–5.2)
Alkaline Phosphatase: 57 U/L (ref 39–117)
Bilirubin, Direct: 0.1 mg/dL (ref 0.0–0.3)
Total Bilirubin: 0.6 mg/dL (ref 0.2–1.2)
Total Protein: 7.4 g/dL (ref 6.0–8.3)

## 2021-05-31 LAB — LIPASE: Lipase: 32 U/L (ref 11.0–59.0)

## 2021-05-31 NOTE — Assessment & Plan Note (Signed)
Postprandial epigastric fullness/knot associated with indigestion and burning discomfort. Anticipate GERD related symptoms r/o gastritis, gallstone disease, other. Check labs today. Start omeprazole 40mg  daily x3 wks then PRN. Update if not improving with this, consider abd [place] to evaluate for gallstones. Discussed limiting alcohol, NSAIDs, caffeine.  No red flags.

## 2022-05-16 ENCOUNTER — Encounter: Payer: Self-pay | Admitting: Internal Medicine

## 2022-05-16 ENCOUNTER — Ambulatory Visit: Payer: BC Managed Care – PPO | Admitting: Internal Medicine

## 2022-05-16 VITALS — BP 108/60 | HR 68 | Temp 97.7°F | Ht 71.0 in | Wt 167.0 lb

## 2022-05-16 DIAGNOSIS — N451 Epididymitis: Secondary | ICD-10-CM | POA: Diagnosis not present

## 2022-05-16 MED ORDER — DOXYCYCLINE HYCLATE 100 MG PO TABS: 100.0000 mg | ORAL_TABLET | Freq: Two times a day (BID) | ORAL | 0 refills | Status: DC

## 2022-05-16 NOTE — Progress Notes (Signed)
   Subjective:    Patient ID: Edward Lin, male    DOB: [birth year], 36 y.o.   MRN: [id number]  HPI Here due to a lump on his testes  Noticed this 3 days---right testes At first 1 x 0.5 inches--now down in size Is tender No local trauma Monogamous --no concerns about STD  Current Outpatient Medications on File Prior to Visit  Medication Sig Dispense Refill   amphetamine-dextroamphetamine (ADDERALL XR) 10 MG 24 hr capsule Take 1 capsule (10 mg total) by mouth daily. 30 capsule 0   loratadine (CLARITIN) 10 MG tablet Take 10 mg by mouth daily as needed for allergies.     omeprazole (PRILOSEC) 40 MG capsule Take 1 capsule ('40mg'$ ) daily for 3 weeks then as needed 30 capsule 2   No current facility-administered medications on file prior to visit.    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 prostate   Cancer Maternal Uncle        esophageal   Heart attack Paternal Uncle 16   Stroke Neg Hx     Social History   Socioeconomic History   Marital status: Married    Spouse name: Not on file   Number of children: 1   Years of education: 14   Highest education level: Not on file  Occupational History   Not on file  Tobacco Use   Smoking status: Never   Smokeless tobacco: Never  Vaping Use   Vaping Use: Never used  Substance and Sexual Activity   Alcohol use: Yes    Comment: occasional   Drug use: No   Sexual activity:  Not on file  Other Topics Concern   Not on file  Social History Narrative   Lives with wife and son and daughter   Occ: Dealer   Fun/Hobby: Sleep, Snowboard    Exercise: no regular exercise - time constraints   Diet: good water, fruits/vegetables daily, enjoys cheer wine    Social Determinants of Health   Financial Resource Strain: Not on file  Food Insecurity: Not on file  Transportation Needs: Not on file  Physical Activity: Not on file  Stress: Not on file  Social Connections: Not on file  Intimate Partner Violence: Not on file   Review of Systems No trouble voiding or dysuria No fever Vasectomy some years ago    Objective:   Physical Exam Constitutional:      Appearance: Normal appearance.  Genitourinary:    Comments: Mild swelling in right hemiscrotum Tender right epididymal cyst Right testes seems normal Left side normal Neurological:     Mental Status: He is alert.            Assessment & Plan:

## 2022-05-16 NOTE — Assessment & Plan Note (Addendum)
Does not seem to involve testes Will treat with doxy 100 bid for 7 days----if not completely back to normal next week, will need reevaluation or check by urologist Recommended OTC NSAIDs as well (advil or aleve)

## 2022-06-08 ENCOUNTER — Ambulatory Visit: Payer: BC Managed Care – PPO | Admitting: Nurse Practitioner

## 2022-06-08 ENCOUNTER — Encounter: Payer: Self-pay | Admitting: Nurse Practitioner

## 2022-06-08 VITALS — BP 114/74 | HR 62 | Temp 97.3°F | Ht 71.0 in | Wt 175.4 lb

## 2022-06-08 DIAGNOSIS — J3489 Other specified disorders of nose and nasal sinuses: Secondary | ICD-10-CM

## 2022-06-08 DIAGNOSIS — J0110 Acute frontal sinusitis, unspecified: Secondary | ICD-10-CM | POA: Diagnosis not present

## 2022-06-08 DIAGNOSIS — R52 Pain, unspecified: Secondary | ICD-10-CM

## 2022-06-08 LAB — POC COVID19 BINAXNOW: SARS Coronavirus 2 Ag: NEGATIVE

## 2022-06-08 MED ORDER — DOXYCYCLINE HYCLATE 100 MG PO TABS: 100.0000 mg | ORAL_TABLET | Freq: Two times a day (BID) | ORAL | 0 refills | Status: DC

## 2022-06-08 NOTE — Patient Instructions (Signed)
It was great to see you!  Start doxycycline twice a day for 10 days with food.   Keep drinking plenty of fluids.   Flonase nasal spray, once a day,  is good for congestion and the sinus and ear pressure.   Let's follow-up if your symptoms worsen or don't improve.   Take care,  Vance Peper, NP

## 2022-06-08 NOTE — Progress Notes (Signed)
Acute Office Visit  Subjective:     Patient ID: Edward Lin, male    DOB: [birth year], 36 y.o.   MRN: [id number]  Chief Complaint  Patient presents with   Acute Visit    Had  some congestion in chest last week , notice this morning have sinus pressure and congestion , no fever , some coughing not taking any medication     HPI Patient is in today for congestion and coughing for the last couple weeks. He states that it started as a chest cold last week and then the beginning of this week, it moved into his sinuses.   UPPER RESPIRATORY TRACT INFECTION  Fever: no Cough: yes - getting better Shortness of breath: yes at times Wheezing: no Chest pain: no Chest tightness: no Chest congestion: no Nasal congestion: yes Runny nose: yes Post nasal drip: no Sneezing: no Sore throat: no Swollen glands: no Sinus pressure: yes Headache: yes Face pain: yes Toothache: no Ear pain: no bilateral Ear pressure: yes bilateral Eyes red/itching:no Eye drainage/crusting: no  Vomiting: no Rash: no Fatigue: yes Sick contacts: yes - kids Strep contacts: no  Context: fluctuating Recurrent sinusitis: no Relief with OTC cold/cough medications: no  Treatments attempted: tylenol    ROS See pertinent positives and negatives per HPI.     Objective:    BP 114/74   Pulse 62   Temp (!) 97.3 F (36.3 C)   Ht '5\' 11"'$  (1.803 m)   Wt 175 lb 6.4 oz (79.6 kg)   SpO2 98%   BMI 24.46 kg/m    Physical Exam Vitals and nursing note reviewed.  Constitutional:      Appearance: Normal appearance.  HENT:     Head: Normocephalic.     Right Ear: Tympanic membrane, ear canal and external ear normal.     Left Ear: Tympanic membrane, ear canal and external ear normal.     Nose:     Right Sinus: Frontal sinus tenderness (pressure) present. No maxillary sinus tenderness.     Left Sinus: Frontal sinus tenderness (pressure) present. No maxillary sinus tenderness.  Eyes:     Conjunctiva/sclera:  Conjunctivae normal.  Cardiovascular:     Rate and Rhythm: Normal rate and regular rhythm.     Pulses: Normal pulses.     Heart sounds: Normal heart sounds.  Pulmonary:     Effort: Pulmonary effort is normal.     Breath sounds: Normal breath sounds.  Musculoskeletal:     Cervical back: Normal range of motion and neck supple. No tenderness.  Lymphadenopathy:     Cervical: No cervical adenopathy.  Skin:    General: Skin is warm.  Neurological:     General: No focal deficit present.     Mental Status: He is alert and oriented to person, place, and time.  Psychiatric:        Mood and Affect: Mood normal.        Behavior: Behavior normal.        Thought Content: Thought content normal.        Judgment: Judgment normal.     Results for orders placed or performed in visit on 06/08/22  POC COVID-19  Result Value Ref Range   SARS Coronavirus 2 Ag Negative Negative        Assessment & Plan:   Problem List Items Addressed This Visit   None Visit Diagnoses     Acute non-recurrent frontal sinusitis    -  Primary   Will  treat with doxycycline '100mg'$  BID x10 days. Encourage fluids, rest. Continue tylenol prn pain. F/U if not improving.   Relevant Medications   doxycycline (VIBRA-TABS) 100 MG tablet   Stuffy and runny nose       Covid negative. See plan for sinusitis above. Can use claritin or flonase daily for congestion. Encourage fluids and rest.   Relevant Orders   POC COVID-19 (Completed)       Meds ordered this encounter  Medications   doxycycline (VIBRA-TABS) 100 MG tablet    Sig: Take 1 tablet (100 mg total) by mouth 2 (two) times daily.    Dispense:  20 tablet    Refill:  0    Return if symptoms worsen or fail to improve.  Charyl Dancer, NP

## 2022-06-09 ENCOUNTER — Encounter: Payer: Self-pay | Admitting: Nurse Practitioner

## 2022-06-12 ENCOUNTER — Ambulatory Visit: Payer: BC Managed Care – PPO | Admitting: Family Medicine

## 2023-02-12 ENCOUNTER — Telehealth: Payer: Self-pay | Admitting: Family Medicine

## 2023-02-12 NOTE — Telephone Encounter (Signed)
Prescription Request  02/12/2023  LOV: Visit date not found  What is the name of the medication or equipment?  [id number] [place], [place] - [address] Phone: [phone]  Fax: [phone]      Have you contacted your pharmacy to request a refill? Yes   Which pharmacy would you like this sent to?   [id number] [place], [place] - [address] [place] [address] Phone: [phone] Fax: [phone]    Patient notified that their request is being sent to the clinical staff for review and that they should receive a response within 2 business days.   Please advise at Mobile [phone] (mobile)

## 2023-02-12 NOTE — Telephone Encounter (Signed)
Will see on Friday

## 2023-02-12 NOTE — Telephone Encounter (Signed)
Called patient wanted to get refill on Adderall. Patient last office visit was 05/30/21. Advised will need to be see in office for any restart on medications. He will be starting a new job next week. Wanted to be seen on Friday. I have put in same day slot on Friday. Patient is aware no medications will be called in until office visit and he can discuss with provider.

## 2023-02-16 ENCOUNTER — Ambulatory Visit: Payer: BC Managed Care – PPO | Admitting: Family Medicine

## 2023-04-01 ENCOUNTER — Other Ambulatory Visit: Payer: Self-pay | Admitting: Family Medicine

## 2023-04-01 DIAGNOSIS — Z136 Encounter for screening for cardiovascular disorders: Secondary | ICD-10-CM

## 2023-04-01 DIAGNOSIS — Z1159 Encounter for screening for other viral diseases: Secondary | ICD-10-CM

## 2023-04-01 DIAGNOSIS — Z131 Encounter for screening for diabetes mellitus: Secondary | ICD-10-CM

## 2023-04-03 ENCOUNTER — Other Ambulatory Visit (INDEPENDENT_AMBULATORY_CARE_PROVIDER_SITE_OTHER): Payer: 59

## 2023-04-03 DIAGNOSIS — Z13220 Encounter for screening for lipoid disorders: Secondary | ICD-10-CM | POA: Diagnosis not present

## 2023-04-03 DIAGNOSIS — Z131 Encounter for screening for diabetes mellitus: Secondary | ICD-10-CM | POA: Diagnosis not present

## 2023-04-03 DIAGNOSIS — Z1159 Encounter for screening for other viral diseases: Secondary | ICD-10-CM | POA: Diagnosis not present

## 2023-04-03 DIAGNOSIS — Z136 Encounter for screening for cardiovascular disorders: Secondary | ICD-10-CM | POA: Diagnosis not present

## 2023-04-03 LAB — LIPID PANEL
Cholesterol: 204 mg/dL — ABNORMAL HIGH (ref 0–200)
HDL: 65.1 mg/dL (ref >39.00)
LDL Cholesterol: 128 mg/dL — ABNORMAL HIGH (ref 0–99)
NonHDL: 139.08
Total CHOL/HDL Ratio: 3
Triglycerides: 56 mg/dL (ref 0.0–149.0)
VLDL: 11.2 mg/dL (ref 0.0–40.0)

## 2023-04-03 LAB — BASIC METABOLIC PANEL
BUN: 20 mg/dL (ref 6–23)
CO2: 27 meq/L (ref 19–32)
Calcium: 9.5 mg/dL (ref 8.4–10.5)
Chloride: 105 meq/L (ref 96–112)
Creatinine, Ser: 1.09 mg/dL (ref 0.40–1.50)
GFR: 87.06 mL/min (ref >60.00)
Glucose, Bld: 96 mg/dL (ref 70–99)
Potassium: 4.5 meq/L (ref 3.5–5.1)
Sodium: 139 meq/L (ref 135–145)

## 2023-04-04 LAB — HEPATITIS C ANTIBODY: Hepatitis C Ab: NONREACTIVE

## 2023-04-10 ENCOUNTER — Ambulatory Visit (INDEPENDENT_AMBULATORY_CARE_PROVIDER_SITE_OTHER): Payer: 59 | Admitting: Family Medicine

## 2023-04-10 ENCOUNTER — Encounter: Payer: Self-pay | Admitting: Family Medicine

## 2023-04-10 VITALS — BP 116/78 | HR 60 | Temp 98.1°F | Ht 69.5 in | Wt 168.5 lb

## 2023-04-10 DIAGNOSIS — R200 Anesthesia of skin: Secondary | ICD-10-CM

## 2023-04-10 DIAGNOSIS — M5416 Radiculopathy, lumbar region: Secondary | ICD-10-CM | POA: Insufficient documentation

## 2023-04-10 DIAGNOSIS — F988 Other specified behavioral and emotional disorders with onset usually occurring in childhood and adolescence: Secondary | ICD-10-CM | POA: Diagnosis not present

## 2023-04-10 DIAGNOSIS — Z0000 Encounter for general adult medical examination without abnormal findings: Secondary | ICD-10-CM

## 2023-04-10 MED ORDER — OMEPRAZOLE 20 MG PO CPDR: 20.0000 mg | DELAYED_RELEASE_CAPSULE | Freq: Every day | ORAL | Status: DC | PRN

## 2023-04-10 MED ORDER — AMPHETAMINE-DEXTROAMPHET ER 10 MG PO CP24: 10.0000 mg | ORAL_CAPSULE | Freq: Every day | ORAL | 0 refills | Status: DC

## 2023-04-10 NOTE — Assessment & Plan Note (Signed)
Preventative protocols reviewed and updated unless pt declined. Discussed healthy diet and lifestyle.  

## 2023-04-10 NOTE — Assessment & Plan Note (Signed)
Stable period off medication. Adderall XR refilled #30 to take PRN, uses sparingly.

## 2023-04-10 NOTE — Patient Instructions (Addendum)
Try exercises provided today - piriformis syndrome. Take B12 supplement for 1 month.  If no better, schedule appointment with Dr Patsy Lager sports medicine.  Good to see you today Return as needed or in 1-2 years for next physical.

## 2023-04-10 NOTE — Assessment & Plan Note (Addendum)
Chronic left leg pain from thigh down associated with lateral thigh and foot numbness worse with certain positions of prolonged pressure ie long drive.  Overall reassuring exam.  No red flags.  Suspected sciatica but no pain to palpation of sciatic notch. Provided with piriformis syndrome stretching exercises.  He also has evidence of mild pes planus on left.  In PPI use, suggested take B12 replacement x 1 month to monitor effect, consider checking B12 levels next labwork.  If not improving, recommend f/u with sports medicine Dr Patsy Lager for evaluation.

## 2023-04-10 NOTE — Progress Notes (Signed)
Ph: [phone] Fax: [phone]   Patient ID: Edward Lin, male    DOB: Jul 16, 1985, 37 y.o.   MRN: [id number]  This visit was conducted in person.  BP 116/78   Pulse 60   Temp 98.1 F (36.7 C) (Oral)   Ht 5' 9.5" (1.765 m)   Wt 168 lb 8 oz (76.4 kg)   SpO2 100%   BMI 24.53 kg/m    CC: CPE Subjective:   HPI: Edward Lin is a 37 y.o. 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   ADHD - on adderall XR 10mg  daily, tolerating well. 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 Has not needed for new job.   H/o chronic pelvic pain - has seen urology manages with flomax and naprosyn, stable period off medication recently.   Notes years of intermittent L foot numbness worse with tight shoe, L leg ache and lateral thigh numbness worse with driving. Pain does improve with adjusting weight on left leg/buttock. Progressively worsening. Has been seeing chiropractor with benefit - ?sciatic nerve issue. No fevers/chills, saddle anesthesia, bowel/bladder incontinence, weakness of legs. Denies inciting trauma/injury, no prior back surgeries   Preventative: Flu shot - declines COVID - declined Tdap 2016  Seat belt use discussed  Sunscreen use discussed. No changing moles on skin.  Sleep - averaging 6-7 hours/night Non smoker Alcohol - rare Dentist q6 mo  Eye exam yearly - wears contacts   Lives with wife Rosanne Sack) and son and daughter Occ: Curator Fun/Hobby: Snowboard  Exercise: fitness center at work  Diet: good water, fruits/vegetables daily, some coffee and sweet tea      Relevant past medical, surgical, family and social history reviewed and updated as indicated. Interim medical history since our last visit reviewed. Allergies and medications reviewed and updated. Outpatient Medications Prior to Visit  Medication Sig Dispense Refill   loratadine (CLARITIN)  10 MG tablet Take 10 mg by mouth daily as needed for allergies.     amphetamine-dextroamphetamine (ADDERALL XR) 10 MG 24 hr capsule Take 1 capsule (10 mg total) by mouth daily. (Patient taking differently: Take 10 mg by mouth daily. As needed) 30 capsule 0   omeprazole (PRILOSEC) 40 MG capsule Take 1 capsule (40mg ) daily for 3 weeks then as needed 30 capsule 2   No facility-administered medications prior to visit.     Per HPI unless specifically indicated in ROS section below Review of Systems  Constitutional:  Negative for activity change, appetite change, chills, fatigue, fever and unexpected weight change.  HENT:  Negative for hearing loss.   Eyes:  Negative for visual disturbance.  Respiratory:  Negative for cough, chest tightness, shortness of breath and wheezing.   Cardiovascular:  Negative for chest pain, palpitations and leg swelling.  Gastrointestinal:  Negative for abdominal distention, abdominal pain, blood in stool, constipation, diarrhea, nausea and vomiting.  Genitourinary:  Negative for difficulty urinating and hematuria.  Musculoskeletal:  Negative for arthralgias, myalgias and neck pain.  Skin:  Negative for rash.  Neurological:  Negative for dizziness, seizures, syncope and headaches.  Hematological:  Negative for adenopathy. Does not bruise/bleed easily.  Psychiatric/Behavioral:  Negative for dysphoric mood. The patient is not nervous/anxious.     Objective:  BP 116/78   Pulse 60   Temp 98.1 F (36.7 C) (Oral)   Ht 5' 9.5" (1.765 m)   Wt 168 lb 8 oz (76.4 kg)  SpO2 100%   BMI 24.53 kg/m   Wt Readings from Last 3 Encounters:  04/10/23 168 lb 8 oz (76.4 kg)  06/08/22 175 lb 6.4 oz (79.6 kg)  05/16/22 167 lb (75.8 kg)      Physical Exam Vitals and nursing note reviewed.  Constitutional:      General: He is not in acute distress.    Appearance: Normal appearance. He is well-developed. He is not ill-appearing.  HENT:     Head: Normocephalic and atraumatic.      Right Ear: Hearing, tympanic membrane, ear canal and external ear normal.     Left Ear: Hearing, tympanic membrane, ear canal and external ear normal.     Mouth/Throat:     Mouth: Mucous membranes are moist.     Pharynx: Oropharynx is clear. No oropharyngeal exudate or posterior oropharyngeal erythema.  Eyes:     General: No scleral icterus.    Extraocular Movements: Extraocular movements intact.     Conjunctiva/sclera: Conjunctivae normal.     Pupils: Pupils are equal, round, and reactive to light.  Neck:     Thyroid: No thyroid mass or thyromegaly.  Cardiovascular:     Rate and Rhythm: Normal rate and regular rhythm.     Pulses: Normal pulses.          Radial pulses are 2+ on the right side and 2+ on the left side.     Heart sounds: Normal heart sounds. No murmur heard. Pulmonary:     Effort: Pulmonary effort is normal. No respiratory distress.     Breath sounds: Normal breath sounds. No wheezing, rhonchi or rales.  Abdominal:     General: Bowel sounds are normal. There is no distension.     Palpations: Abdomen is soft. There is no mass.     Tenderness: There is no abdominal tenderness. There is no guarding or rebound.     Hernia: No hernia is present.  Musculoskeletal:        General: Normal range of motion.     Cervical back: Normal range of motion and neck supple.     Right lower leg: No edema.     Left lower leg: No edema.     Comments:  2+ DP bilaterally  No pain midline spine No paraspinous mm tenderness Neg SLR bilaterally. No pain with int/ext rotation at hip. Neg FABER. No pain at SIJ, GTB or sciatic notch bilaterally.  No obvious thoracic or lumbar scoliosis  Mild loss of longitudinal arch on left with standing  Lymphadenopathy:     Cervical: No cervical adenopathy.  Skin:    General: Skin is warm and dry.     Findings: No rash.  Neurological:     General: No focal deficit present.     Mental Status: He is alert and oriented to person, place, and  time.     Sensory: Sensation is intact.     Motor: Motor function is intact.     Coordination: Coordination is intact.     Gait: Gait is intact.     Comments:  Diminished DTRs to BLE Sensation intact, strength testing intact  Psychiatric:        Mood and Affect: Mood normal.        Behavior: Behavior normal.        Thought Content: Thought content normal.        Judgment: Judgment normal.       Results for orders placed or performed in visit on 04/03/23  Hepatitis  C antibody  Result Value Ref Range   Hepatitis C Ab NON-REACTIVE NON-REACTIVE  Basic metabolic panel  Result Value Ref Range   Sodium 139 135 - 145 mEq/L   Potassium 4.5 3.5 - 5.1 mEq/L   Chloride 105 96 - 112 mEq/L   CO2 27 19 - 32 mEq/L   Glucose, Bld 96 70 - 99 mg/dL   BUN 20 6 - 23 mg/dL   Creatinine, Ser 7.82 0.40 - 1.50 mg/dL   GFR 95.62 >13.08 mL/min   Calcium 9.5 8.4 - 10.5 mg/dL  Lipid panel  Result Value Ref Range   Cholesterol 204 (H) 0 - 200 mg/dL   Triglycerides 65.7 0.0 - 149.0 mg/dL   HDL 84.69 >62.95 mg/dL   VLDL 28.4 0.0 - 13.2 mg/dL   LDL Cholesterol 440 (H) 0 - 99 mg/dL   Total CHOL/HDL Ratio 3    NonHDL 139.08     Assessment & Plan:   Problem List Items Addressed This Visit     Health maintenance examination - Primary (Chronic)    Preventative protocols reviewed and updated unless pt declined. Discussed healthy diet and lifestyle.       Attention deficit disorder (ADD) in adult    Stable period off medication. Adderall XR refilled #30 to take PRN, uses sparingly.       Relevant Medications   amphetamine-dextroamphetamine (ADDERALL XR) 10 MG 24 hr capsule   Left leg numbness    Chronic left leg pain from thigh down associated with lateral thigh and foot numbness worse with certain positions of prolonged pressure ie long drive.  Overall reassuring exam.  No red flags.  Suspected sciatica but no pain to palpation of sciatic notch. Provided with piriformis syndrome stretching  exercises.  He also has evidence of mild pes planus on left.  In PPI use, suggested take B12 replacement x 1 month to monitor effect, consider checking B12 levels next labwork.  If not improving, recommend f/u with sports medicine Dr Patsy Lager for evaluation.         Meds ordered this encounter  Medications   amphetamine-dextroamphetamine (ADDERALL XR) 10 MG 24 hr capsule    Sig: Take 1 capsule (10 mg total) by mouth daily.    Dispense:  30 capsule    Refill:  0   omeprazole (PRILOSEC) 20 MG capsule    Sig: Take 1 capsule (20 mg total) by mouth daily as needed (GERD).    No orders of the defined types were placed in this encounter.   Patient Instructions  Try exercises provided today - piriformis syndrome. Take B12 supplement for 1 month.  If no better, schedule appointment with Dr Patsy Lager sports medicine.  Good to see you today Return as needed or in 1-2 years for next physical.   Follow up plan: Return if symptoms worsen or fail to improve.  Eustaquio Boyden, MD

## 2023-11-26 ENCOUNTER — Telehealth: Payer: Self-pay | Admitting: Family Medicine

## 2023-11-26 NOTE — Telephone Encounter (Signed)
 Spoke with pt notifying him his last CPE was 04/10/23 so he's requesting it early. Pt states his insurance is ok with it since that was his initial CPE with current job. Says now he just needs 1 each calendar yr to qualify for discount.   Scheduled CPE on 02/06/24 at 2:30 and fasting labs on 01/29/24 at 7:45. Pt expresses his thanks.

## 2023-11-26 NOTE — Telephone Encounter (Signed)
 Copied from CRM [id number]. Topic: Appointments - Scheduling Inquiry for Clinic >> Nov 26, 2023  1:40 PM Jenice Mitts wrote: Reason for CRM: Patient is calling because his insurance is requiring him to have a physical by Mar 25, 2024 and the soonest appointment isnt until October. He would like to know if he can be squeezed in anywhere He is also on the waitlist   Can pt be added into a 30 min slot?

## 2024-01-27 ENCOUNTER — Other Ambulatory Visit: Payer: Self-pay | Admitting: Family Medicine

## 2024-01-27 DIAGNOSIS — E785 Hyperlipidemia, unspecified: Secondary | ICD-10-CM | POA: Insufficient documentation

## 2024-01-27 DIAGNOSIS — E7800 Pure hypercholesterolemia, unspecified: Secondary | ICD-10-CM

## 2024-01-29 ENCOUNTER — Other Ambulatory Visit

## 2024-01-29 ENCOUNTER — Telehealth: Payer: Self-pay

## 2024-01-29 NOTE — Telephone Encounter (Signed)
 Edward Lin

## 2024-01-29 NOTE — Telephone Encounter (Signed)
 Sending note to [facility] admin.

## 2024-02-04 ENCOUNTER — Other Ambulatory Visit (INDEPENDENT_AMBULATORY_CARE_PROVIDER_SITE_OTHER)

## 2024-02-04 ENCOUNTER — Ambulatory Visit: Payer: Self-pay | Admitting: Family Medicine

## 2024-02-04 DIAGNOSIS — E7800 Pure hypercholesterolemia, unspecified: Secondary | ICD-10-CM | POA: Diagnosis not present

## 2024-02-04 LAB — LIPID PANEL
Cholesterol: 206 mg/dL — ABNORMAL HIGH (ref 0–200)
HDL: 61.2 mg/dL (ref >39.00)
LDL Cholesterol: 134 mg/dL — ABNORMAL HIGH (ref 0–99)
NonHDL: 145.13
Total CHOL/HDL Ratio: 3
Triglycerides: 58 mg/dL (ref 0.0–149.0)
VLDL: 11.6 mg/dL (ref 0.0–40.0)

## 2024-02-04 LAB — COMPREHENSIVE METABOLIC PANEL WITH GFR
ALT: 25 U/L (ref 0–53)
AST: 17 U/L (ref 0–37)
Albumin: 4.3 g/dL (ref 3.5–5.2)
Alkaline Phosphatase: 67 U/L (ref 39–117)
BUN: 18 mg/dL (ref 6–23)
CO2: 28 meq/L (ref 19–32)
Calcium: 8.8 mg/dL (ref 8.4–10.5)
Chloride: 104 meq/L (ref 96–112)
Creatinine, Ser: 1.03 mg/dL (ref 0.40–1.50)
GFR: 92.63 mL/min (ref >60.00)
Glucose, Bld: 92 mg/dL (ref 70–99)
Potassium: 3.8 meq/L (ref 3.5–5.1)
Sodium: 139 meq/L (ref 135–145)
Total Bilirubin: 0.6 mg/dL (ref 0.2–1.2)
Total Protein: 6.3 g/dL (ref 6.0–8.3)

## 2024-02-04 LAB — TSH: TSH: 0.62 u[IU]/mL (ref 0.35–5.50)

## 2024-02-06 ENCOUNTER — Ambulatory Visit (INDEPENDENT_AMBULATORY_CARE_PROVIDER_SITE_OTHER): Admitting: Family Medicine

## 2024-02-06 VITALS — BP 110/60 | HR 67 | Temp 98.6°F | Ht 69.5 in | Wt 172.0 lb

## 2024-02-06 DIAGNOSIS — R200 Anesthesia of skin: Secondary | ICD-10-CM

## 2024-02-06 DIAGNOSIS — Z0001 Encounter for general adult medical examination with abnormal findings: Secondary | ICD-10-CM

## 2024-02-06 DIAGNOSIS — R12 Heartburn: Secondary | ICD-10-CM

## 2024-02-06 DIAGNOSIS — F988 Other specified behavioral and emotional disorders with onset usually occurring in childhood and adolescence: Secondary | ICD-10-CM | POA: Diagnosis not present

## 2024-02-06 DIAGNOSIS — M5416 Radiculopathy, lumbar region: Secondary | ICD-10-CM

## 2024-02-06 DIAGNOSIS — E7800 Pure hypercholesterolemia, unspecified: Secondary | ICD-10-CM

## 2024-02-06 MED ORDER — AMPHETAMINE-DEXTROAMPHET ER 10 MG PO CP24: 10.0000 mg | ORAL_CAPSULE | Freq: Every day | ORAL | 0 refills | Status: AC

## 2024-02-06 MED ORDER — OMEPRAZOLE 20 MG PO CPDR: 20.0000 mg | DELAYED_RELEASE_CAPSULE | Freq: Every day | ORAL | Status: AC | PRN

## 2024-02-06 NOTE — Assessment & Plan Note (Signed)
 Stable period with rare stimulant use

## 2024-02-06 NOTE — Patient Instructions (Addendum)
 B12 check - either add on or labs today Sounds like sciatic pain or nerve irritation coming from spine possibly from remote injury.  Recommend referral to physical therapy - update me if ongoing or worsening symptoms.  Possible milia as cause of bump - let us  know if enlarging or spreading.  Good to see you today.  Return as needed or in 1 year for next physical.

## 2024-02-06 NOTE — Assessment & Plan Note (Addendum)
 Stable period with rare stimulant use - Adderall XR refilled today

## 2024-02-06 NOTE — Progress Notes (Signed)
 Ph: [phone] Fax: [phone]   Patient ID: Edward Lin, male    DOB: 07/02/1985, 38 y.o.   MRN: [id number]  This visit was conducted in person.  BP 110/60   Pulse 67   Temp 98.6 F (37 C) (Oral)   Ht 5' 9.5 (1.765 m)   Wt 172 lb (78 kg)   SpO2 99%   BMI 25.04 kg/m    CC: CPE Subjective:   HPI: Edward Lin is a 38 y.o. male presenting on 02/06/2024 for Annual Exam (Left leg pain ongoing with numbness. )   Can get 1 physical each calendar year.  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    ADHD - on adderall XR 10mg  daily, tolerating well. Uses very intermittently, last filled #30 03/2023. 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   H/o chronic pelvic pain - has seen urology manages with flomax  and naprosyn , stable period off medication recently.   Ongoing left leg pain and numbness worse with prolonged sitting suspicious for sciatica ongoing for years - this did improve with chiropractor and/or b12 replacement but he ran out of b12 and hasn't refilled or returned to chiro. Symptoms have recurred over the past month. He needs to remove his shoe for prolonged drives due to discomfort. Pain starts to left lower back and travels down leg to foot. Currently feeling discomfort to anterior lower leg and dorsal foot, numbness to lower leg, down to lateral dorsal foot and toes and heel.  Loosens L shoe compared to right to prevent worsening of pain. No other numbness throughout body.  Did have a fall off snow board ~2008 - symptoms may have started after this.   Preventative: Flu shot - declined COVID - declined Tdap 2016  Seat belt use discussed  Sunscreen use discussed. No changing moles on skin.  Sleep - averaging 6-7 hours/night Non smoker Alcohol - rare Dentist q6 mo  Eye exam yearly - wears contacts    Lives with wife Bertrand) and son and daughter Occ: Curator Fun/Hobby:  Snowboard  Exercise: regularly working out - at fitness center at work or running outdoors Diet: good water, fruits/vegetables daily, some coffee /sweet tea      Relevant past medical, surgical, family and social history reviewed and updated as indicated. Interim medical history since our last visit reviewed. Allergies and medications reviewed and updated. Outpatient Medications Prior to Visit  Medication Sig Dispense Refill   loratadine (CLARITIN) 10 MG tablet Take 10 mg by mouth daily as needed for allergies.     omeprazole  (PRILOSEC) 20 MG capsule Take 1 capsule (20 mg total) by mouth daily as needed (GERD).     amphetamine -dextroamphetamine  (ADDERALL XR) 10 MG 24 hr capsule Take 1 capsule (10 mg total) by mouth daily. (Patient not taking: Reported on 02/06/2024) 30 capsule 0   No facility-administered medications prior to visit.     Per HPI unless specifically indicated in ROS section below Review of Systems  Constitutional:  Negative for activity change, appetite change, chills, fatigue, fever and unexpected weight change.  HENT:  Negative for hearing loss.   Eyes:  Negative for visual disturbance.  Respiratory:  Negative for cough, chest tightness, shortness of breath and wheezing.   Cardiovascular:  Negative for chest pain, palpitations and leg swelling.  Gastrointestinal:  Negative for abdominal distention, abdominal pain, blood in stool, constipation, diarrhea, nausea and vomiting.  Genitourinary:  Negative for difficulty urinating and  hematuria.  Musculoskeletal:  Negative for arthralgias, myalgias and neck pain.  Skin:  Negative for rash.  Neurological:  Negative for dizziness, seizures, syncope and headaches.  Hematological:  Negative for adenopathy. Does not bruise/bleed easily.  Psychiatric/Behavioral:  Negative for dysphoric mood. The patient is not nervous/anxious.     Objective:  BP 110/60   Pulse 67   Temp 98.6 F (37 C) (Oral)   Ht 5' 9.5 (1.765 m)   Wt 172 lb  (78 kg)   SpO2 99%   BMI 25.04 kg/m   Wt Readings from Last 3 Encounters:  02/06/24 172 lb (78 kg)  04/10/23 168 lb 8 oz (76.4 kg)  06/08/22 175 lb 6.4 oz (79.6 kg)      Physical Exam Vitals and nursing note reviewed.  Constitutional:      General: He is not in acute distress.    Appearance: Normal appearance. He is well-developed. He is not ill-appearing.  HENT:     Head: Normocephalic and atraumatic.     Right Ear: Hearing, tympanic membrane, ear canal and external ear normal.     Left Ear: Hearing, tympanic membrane, ear canal and external ear normal.     Mouth/Throat:     Mouth: Mucous membranes are moist.     Pharynx: Oropharynx is clear. No oropharyngeal exudate or posterior oropharyngeal erythema.  Eyes:     General: No scleral icterus.    Extraocular Movements: Extraocular movements intact.     Conjunctiva/sclera: Conjunctivae normal.     Pupils: Pupils are equal, round, and reactive to light.  Neck:     Thyroid : No thyroid  mass or thyromegaly.  Cardiovascular:     Rate and Rhythm: Normal rate and regular rhythm.     Pulses: Normal pulses.          Radial pulses are 2+ on the right side and 2+ on the left side.     Heart sounds: Normal heart sounds. No murmur heard. Pulmonary:     Effort: Pulmonary effort is normal. No respiratory distress.     Breath sounds: Normal breath sounds. No wheezing, rhonchi or rales.  Abdominal:     General: Bowel sounds are normal. There is no distension.     Palpations: Abdomen is soft. There is no mass.     Tenderness: There is no abdominal tenderness. There is no guarding or rebound.     Hernia: No hernia is present.  Genitourinary:    Penis: Normal.      Testes: Normal.     Comments: Small milia to R groin above scrotum Musculoskeletal:        General: Normal range of motion.     Cervical back: Normal range of motion and neck supple.     Right lower leg: No edema.     Left lower leg: No edema.     Comments:  2+ DP  bilaterally No pain midline spine No paraspinous mm tenderness Neg SLR bilaterally. No pain with int/ext rotation at hip. Neg FABER. No pain at SIJ, GTB or sciatic notch bilaterally.   Lymphadenopathy:     Cervical: No cervical adenopathy.  Skin:    General: Skin is warm and dry.     Findings: No rash.  Neurological:     General: No focal deficit present.     Mental Status: He is alert and oriented to person, place, and time.  Psychiatric:        Mood and Affect: Mood normal.  Behavior: Behavior normal.        Thought Content: Thought content normal.        Judgment: Judgment normal.       Results for orders placed or performed in visit on 02/06/24  Vitamin B12   Collection Time: 02/06/24  3:17 PM  Result Value Ref Range   Vitamin B-12 573 211 - 911 pg/mL   Lab Results  Component Value Date   CHOL 206 (H) 02/04/2024   HDL 61.20 02/04/2024   LDLCALC 134 (H) 02/04/2024   TRIG 58.0 02/04/2024   CHOLHDL 3 02/04/2024    Lab Results  Component Value Date   NA 139 02/04/2024   CL 104 02/04/2024   K 3.8 02/04/2024   CO2 28 02/04/2024   BUN 18 02/04/2024   CREATININE 1.03 02/04/2024   GFR 92.63 02/04/2024   CALCIUM 8.8 02/04/2024   ALBUMIN 4.3 02/04/2024   GLUCOSE 92 02/04/2024    Lab Results  Component Value Date   ALT 25 02/04/2024   AST 17 02/04/2024   ALKPHOS 67 02/04/2024   BILITOT 0.6 02/04/2024    Lab Results  Component Value Date   TSH 0.62 02/04/2024     Assessment & Plan:   Problem List Items Addressed This Visit     Encounter for general adult medical examination with abnormal findings - Primary (Chronic)   Preventative protocols reviewed and updated unless pt declined. Discussed healthy diet and lifestyle.       Attention deficit disorder (ADD) in adult   Stable period with rare stimulant use - Adderall XR refilled today      Relevant Medications   amphetamine -dextroamphetamine  (ADDERALL XR) 10 MG 24 hr capsule   Chronic left-sided  lumbar radiculopathy   Chronic issue, present for years, symptoms along L5/S1 dermatome.  Update B12 levels.  Rec PT course - referral placed to [facility] [facility].  Recommend updated lumbar films given duration of symptoms - he prefers to start with PT.  If no better, will recommend lumbar imaging - xray followed by MRI - for further evaluation.  Consider gabapentin, NSAIDs for symptom relief.       Relevant Medications   amphetamine -dextroamphetamine  (ADDERALL XR) 10 MG 24 hr capsule   Other Relevant Orders   Vitamin B12 (Completed)   Ambulatory referral to Physical Therapy   Hyperlipidemia   Reviewed cholesterol levels as well as diet choices to improve cholesterol control.       Heartburn   Managed with PRN OTC PPI         Meds ordered this encounter  Medications   omeprazole  (PRILOSEC) 20 MG capsule    Sig: Take 1 capsule (20 mg total) by mouth daily as needed (GERD).   amphetamine -dextroamphetamine  (ADDERALL XR) 10 MG 24 hr capsule    Sig: Take 1 capsule (10 mg total) by mouth daily.    Dispense:  30 capsule    Refill:  0    Orders Placed This Encounter  Procedures   Vitamin B12   Ambulatory referral to Physical Therapy    Referral Priority:   Routine    Referral Type:   Physical Medicine    Referral Reason:   Specialty Services Required    Requested Specialty:   Physical Therapy    Number of Visits Requested:   1    Patient Instructions  B12 check - either add on or labs today Sounds like sciatic pain or nerve irritation coming from spine possibly from remote injury.  Recommend referral to  physical therapy - update me if ongoing or worsening symptoms.  Possible milia as cause of bump - let us  know if enlarging or spreading.  Good to see you today.  Return as needed or in 1 year for next physical.   Follow up plan: No follow-ups on file.  Anton Blas, MD

## 2024-02-06 NOTE — Assessment & Plan Note (Signed)
 Preventative protocols reviewed and updated unless pt declined. Discussed healthy diet and lifestyle.

## 2024-02-07 ENCOUNTER — Ambulatory Visit: Payer: Self-pay | Admitting: Family Medicine

## 2024-02-07 ENCOUNTER — Encounter: Payer: Self-pay | Admitting: Family Medicine

## 2024-02-07 DIAGNOSIS — R12 Heartburn: Secondary | ICD-10-CM | POA: Insufficient documentation

## 2024-02-07 LAB — VITAMIN B12: Vitamin B-12: 573 pg/mL (ref 211–911)

## 2024-02-07 NOTE — Assessment & Plan Note (Signed)
 Managed with PRN OTC PPI

## 2024-02-07 NOTE — Assessment & Plan Note (Signed)
 Reviewed cholesterol levels as well as diet choices to improve cholesterol control.

## 2024-02-07 NOTE — Assessment & Plan Note (Signed)
 Chronic issue, present for years, symptoms along L5/S1 dermatome.  Update B12 levels.  Rec PT course - referral placed to [facility].  Recommend updated lumbar films given duration of symptoms - he prefers to start with PT.  If no better, will recommend lumbar imaging - xray followed by MRI - for further evaluation.  Consider gabapentin, NSAIDs for symptom relief.

## 2024-03-10 ENCOUNTER — Telehealth: Payer: Self-pay

## 2024-03-10 NOTE — Telephone Encounter (Signed)
 Letter was sent via my chart from referral teams to call [phone]. Will need to call patient go give information

## 2024-03-10 NOTE — Telephone Encounter (Signed)
 Copied from CRM #[id number]. Topic: General - Other >> Mar 10, 2024  8:27 AM [place] A wrote: Reason for CRM: Patient called and said that he was waiting for the Physical Therapy referral office to contact him but he has not heard anything and that was 02/06/24-Please contact patient

## 2024-03-12 NOTE — Telephone Encounter (Signed)
 Called patent was not able to find letter in my chart. Per patient request sending message with number.

## 2024-04-15 ENCOUNTER — Telehealth: Payer: Self-pay

## 2024-04-15 DIAGNOSIS — M5416 Radiculopathy, lumbar region: Secondary | ICD-10-CM

## 2024-04-15 NOTE — Telephone Encounter (Signed)
 Copied from CRM [id number]. Topic: Referral - Question >> Apr 14, 2024  1:59 PM [place] wrote: Reason for CRM: Patient asking for a new referral sent to [facility] in [place]. The original referral in [place] is over an hour away from his home.

## 2024-04-15 NOTE — Addendum Note (Signed)
 Addended by: RILLA BALLER on: 04/15/2024 12:45 PM   Modules accepted: Orders

## 2024-04-15 NOTE — Telephone Encounter (Signed)
 Previous referral was for physical therapy - I've placed new referral to [facility] in [place].  He may call [phone]  to schedule appt

## 2024-04-18 NOTE — Telephone Encounter (Signed)
 Left message to return call to our office.

## 2024-04-24 ENCOUNTER — Ambulatory Visit: Admitting: Physical Therapy

## 2024-04-28 NOTE — Telephone Encounter (Signed)
 Called patient he did get the information. Wanted to wait until first of the year to call and set up.
